# Patient Record
Sex: Male | Born: 1985 | Race: Black or African American | Hispanic: No | Marital: Single | Smoking: Current every day smoker
Health system: Southern US, Community
[De-identification: ages and names within clinical notes are randomized; demographics above are authoritative.]

## PROBLEM LIST (undated history)

## (undated) DIAGNOSIS — Z789 Other specified health status: Secondary | ICD-10-CM

## (undated) HISTORY — PX: FRACTURE SURGERY: SHX138

## (undated) HISTORY — PX: TONSILLECTOMY: SUR1361

---

## 2004-04-02 ENCOUNTER — Emergency Department: Payer: Self-pay | Admitting: Unknown Physician Specialty

## 2005-03-02 ENCOUNTER — Emergency Department: Payer: Self-pay | Admitting: Emergency Medicine

## 2005-12-07 ENCOUNTER — Emergency Department: Payer: Self-pay | Admitting: Emergency Medicine

## 2007-08-27 ENCOUNTER — Emergency Department: Payer: Self-pay | Admitting: Emergency Medicine

## 2008-05-21 ENCOUNTER — Emergency Department: Payer: Self-pay | Admitting: Emergency Medicine

## 2008-10-18 ENCOUNTER — Emergency Department: Payer: Self-pay | Admitting: Emergency Medicine

## 2009-03-27 ENCOUNTER — Emergency Department: Payer: Self-pay | Admitting: Unknown Physician Specialty

## 2009-03-29 ENCOUNTER — Encounter: Admission: RE | Admit: 2009-03-29 | Discharge: 2009-03-29 | Payer: Self-pay | Admitting: Occupational Medicine

## 2012-11-23 ENCOUNTER — Emergency Department: Payer: Self-pay | Admitting: Emergency Medicine

## 2013-08-09 ENCOUNTER — Emergency Department: Payer: Self-pay | Admitting: Emergency Medicine

## 2013-11-07 IMAGING — CR DG CHEST 2V
1 series · 2 of 2 positions shown · non-contrast
Comparison: none

REASON FOR EXAM: chest pain     flex 41
COMMENTS:   LMP: (Male)

PROCEDURE:     DXR - DXR CHEST PA (OR AP) AND LATERAL  - November 23, 2012 [DATE]
RESULT:     The lungs are clear. The heart and pulmonary vessels are normal.
The bony and mediastinal structures are unremarkable. There is no effusion.
There is no pneumothorax or evidence of congestive failure.

[Series 1: pa · 0.17mm/px · 2 of 2 slices shown]
[im 1/2]
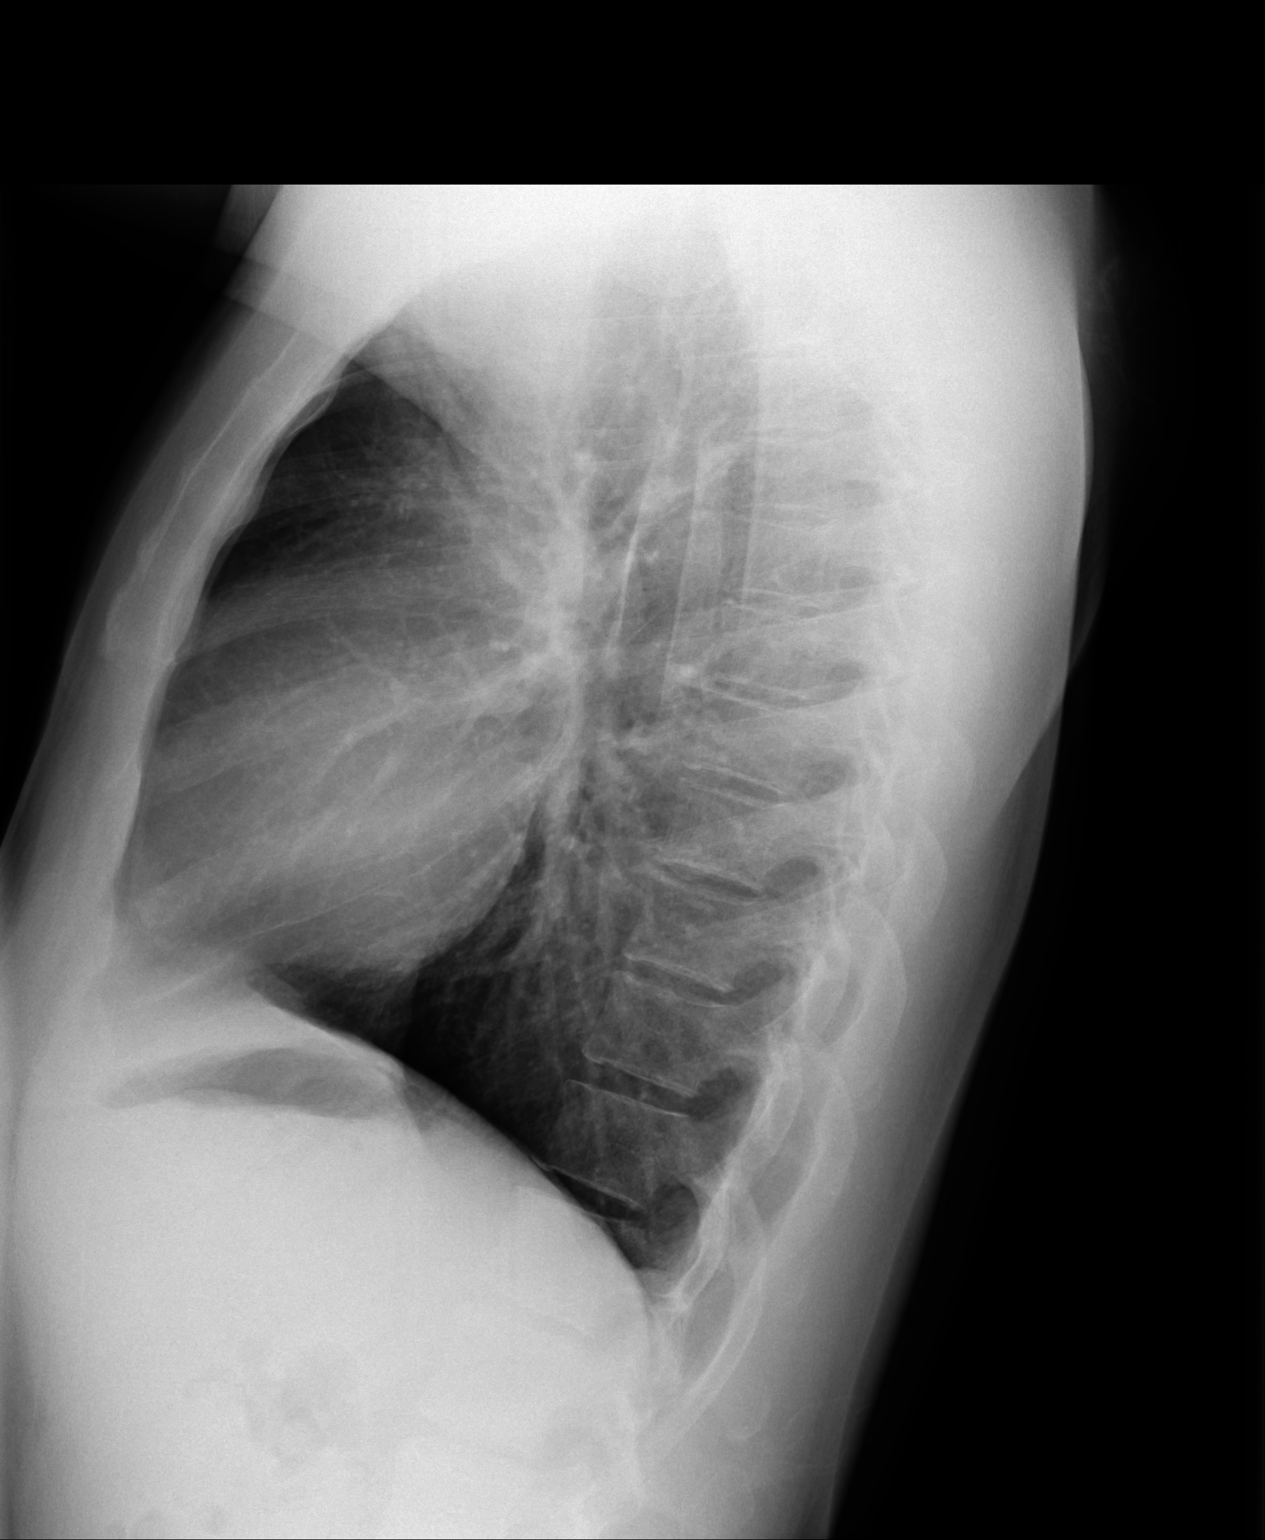
[im 2/2]
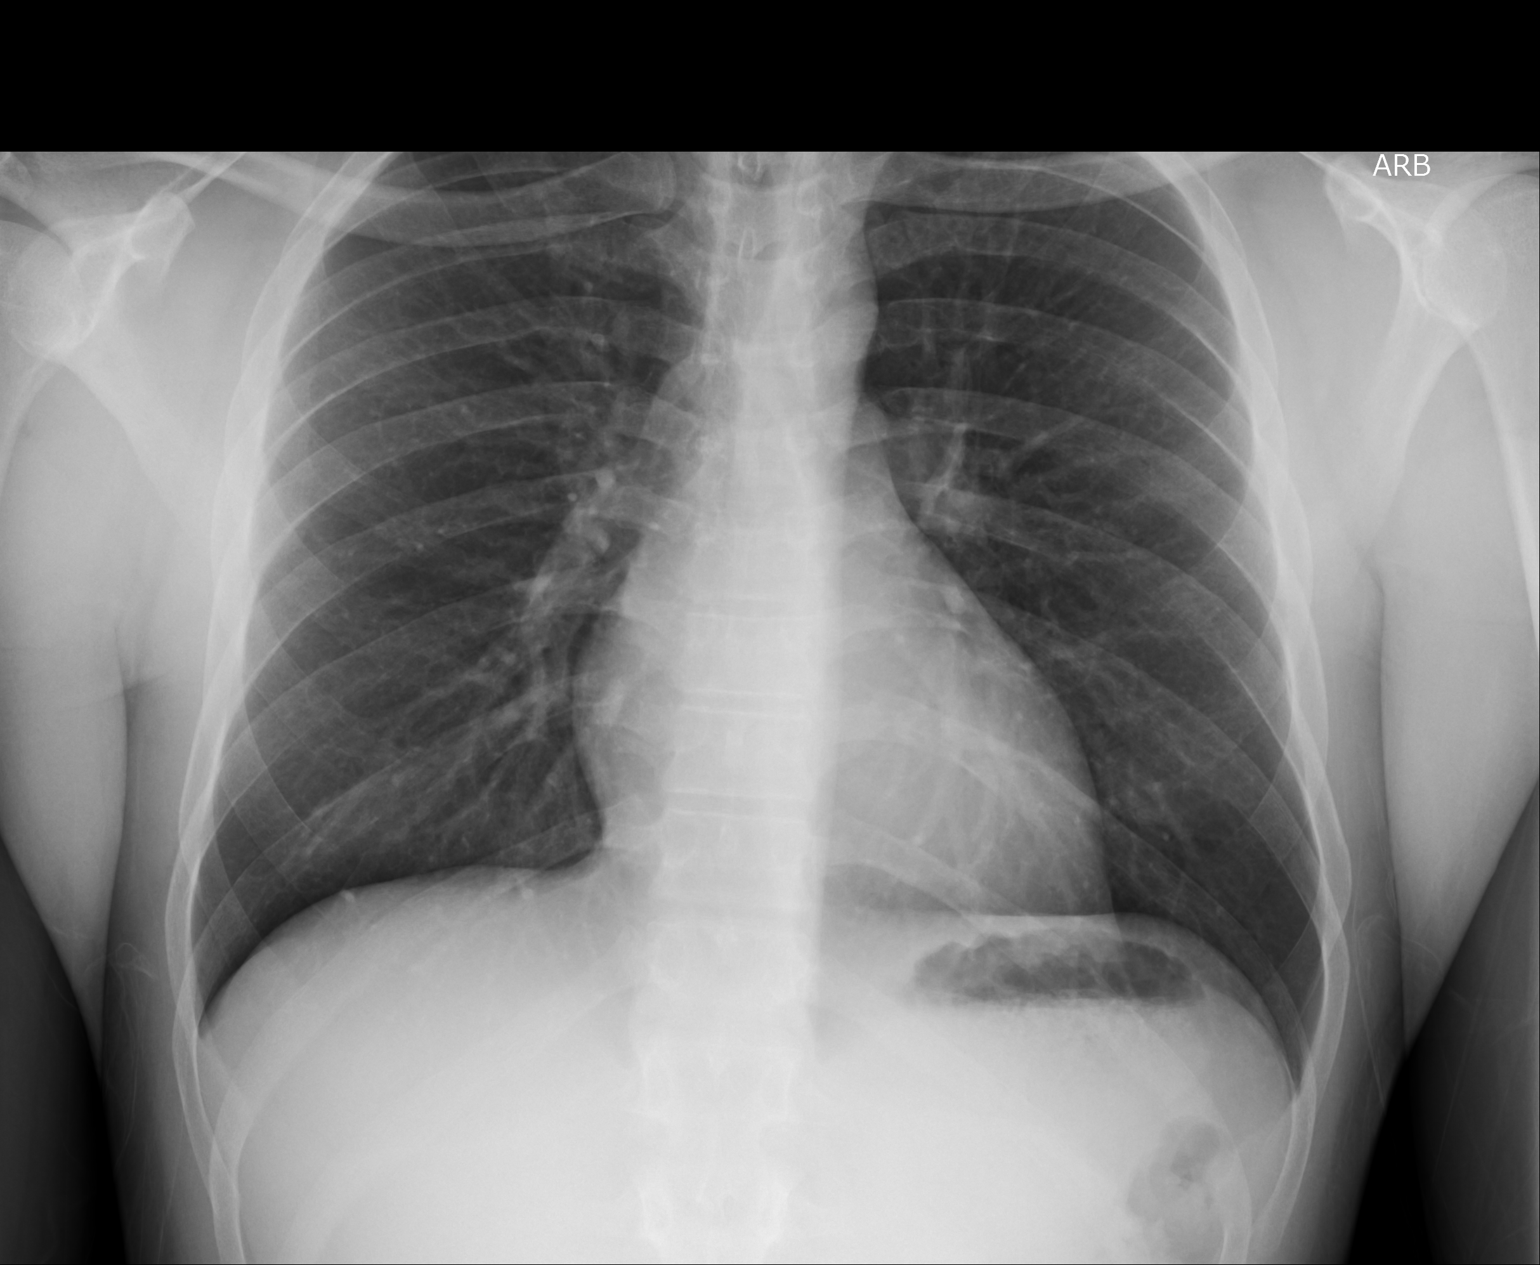

[2 of 2 positions shown; findings below may reference images not displayed]

IMPRESSION: No acute cardiopulmonary disease.

[REDACTED]

## 2022-06-17 ENCOUNTER — Other Ambulatory Visit: Payer: Self-pay

## 2022-06-17 ENCOUNTER — Emergency Department
Admission: EM | Admit: 2022-06-17 | Discharge: 2022-06-17 | Disposition: A | Discharge status: Home / Self Care | Payer: 59 | Attending: Emergency Medicine | Admitting: Emergency Medicine

## 2022-06-17 ENCOUNTER — Emergency Department: Payer: 59

## 2022-06-17 DIAGNOSIS — S52591A Other fractures of lower end of right radius, initial encounter for closed fracture: Secondary | ICD-10-CM | POA: Insufficient documentation

## 2022-06-17 DIAGNOSIS — S52501A Unspecified fracture of the lower end of right radius, initial encounter for closed fracture: Secondary | ICD-10-CM

## 2022-06-17 DIAGNOSIS — S6991XA Unspecified injury of right wrist, hand and finger(s), initial encounter: Secondary | ICD-10-CM | POA: Diagnosis present

## 2022-06-17 MED ORDER — HYDROMORPHONE HCL 1 MG/ML IJ SOLN
1.0000 mg | Freq: Once | INTRAMUSCULAR | Status: AC
  Administered 2022-06-17: 1 mg via INTRAMUSCULAR
  Filled 2022-06-17: qty 1

## 2022-06-17 MED ORDER — OXYCODONE-ACETAMINOPHEN 5-325 MG PO TABS: 1.0000 | ORAL_TABLET | ORAL | 0 refills | Status: DC | PRN

## 2022-06-17 MED ORDER — ONDANSETRON 4 MG PO TBDP
4.0000 mg | ORAL_TABLET | Freq: Once | ORAL | Status: AC
  Administered 2022-06-17: 4 mg via ORAL
  Filled 2022-06-17: qty 1
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
## 2022-06-17 NOTE — ED Notes (Signed)
Pt A&Ox4; denies LOC; denies HA currently; R wrist obviously swollen; pt able to wiggle all R fingers; cap refill less than 3 seconds.

## 2022-06-17 NOTE — ED Notes (Signed)
EDP Paduchowski at bedside.  
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
## 2022-06-17 NOTE — Discharge Instructions (Addendum)
Please call the number provided for orthopedics to arrange a follow-up appointment in approximately 1 week for recheck/reevaluation.  Please wear your sling until seen by orthopedics.  He may take your pain medication as needed but only as prescribed.  Do not drink alcohol or drive while taking this medicine.

## 2022-06-21 ENCOUNTER — Ambulatory Visit
Admission: RE | Admit: 2022-06-21 | Discharge: 2022-06-21 | Disposition: A | Discharge status: Home / Self Care | Payer: 59 | Source: Ambulatory Visit | Attending: Orthopedic Surgery | Admitting: Orthopedic Surgery

## 2022-06-21 ENCOUNTER — Other Ambulatory Visit: Payer: Self-pay | Admitting: Orthopedic Surgery

## 2022-06-21 DIAGNOSIS — S52571A Other intraarticular fracture of lower end of right radius, initial encounter for closed fracture: Secondary | ICD-10-CM

## 2022-06-27 ENCOUNTER — Other Ambulatory Visit: Payer: Self-pay | Admitting: Orthopedic Surgery

## 2022-06-27 ENCOUNTER — Encounter: Payer: Self-pay | Admitting: Orthopedic Surgery

## 2022-06-28 ENCOUNTER — Ambulatory Visit: Payer: 59 | Admitting: Anesthesiology

## 2022-06-28 ENCOUNTER — Other Ambulatory Visit: Payer: Self-pay

## 2022-06-28 ENCOUNTER — Encounter
Admission: RE | Disposition: A | Discharge status: Home / Self Care | Payer: Self-pay | Source: Ambulatory Visit | Attending: Orthopedic Surgery

## 2022-06-28 ENCOUNTER — Encounter: Payer: Self-pay | Admitting: Orthopedic Surgery

## 2022-06-28 ENCOUNTER — Ambulatory Visit
Admission: RE | Admit: 2022-06-28 | Discharge: 2022-06-28 | Disposition: A | Discharge status: Home / Self Care | Payer: 59 | Source: Ambulatory Visit | Attending: Orthopedic Surgery | Admitting: Orthopedic Surgery

## 2022-06-28 DIAGNOSIS — G5601 Carpal tunnel syndrome, right upper limb: Secondary | ICD-10-CM | POA: Diagnosis not present

## 2022-06-28 DIAGNOSIS — F17200 Nicotine dependence, unspecified, uncomplicated: Secondary | ICD-10-CM | POA: Diagnosis not present

## 2022-06-28 DIAGNOSIS — W19XXXA Unspecified fall, initial encounter: Secondary | ICD-10-CM | POA: Diagnosis not present

## 2022-06-28 DIAGNOSIS — S52571A Other intraarticular fracture of lower end of right radius, initial encounter for closed fracture: Secondary | ICD-10-CM | POA: Insufficient documentation

## 2022-06-28 DIAGNOSIS — Y9389 Activity, other specified: Secondary | ICD-10-CM | POA: Insufficient documentation

## 2022-06-28 HISTORY — DX: Other specified health status: Z78.9

## 2022-06-28 HISTORY — PX: OPEN REDUCTION INTERNAL FIXATION (ORIF) DISTAL RADIAL FRACTURE: SHX5989

## 2022-06-28 HISTORY — PX: CARPAL TUNNEL RELEASE: SHX101

## 2022-06-28 SURGERY — OPEN REDUCTION INTERNAL FIXATION (ORIF) DISTAL RADIUS FRACTURE
Anesthesia: General | Site: Hand | Laterality: Right

## 2022-06-28 MED ORDER — FENTANYL CITRATE (PF) 100 MCG/2ML IJ SOLN
100.0000 ug | Freq: Once | INTRAMUSCULAR | Status: AC
  Administered 2022-06-28 (×2): 25 ug via INTRAVENOUS
  Administered 2022-06-28: 50 ug via INTRAVENOUS

## 2022-06-28 MED ORDER — 0.9 % SODIUM CHLORIDE (POUR BTL) OPTIME
TOPICAL | Status: DC | PRN
  Administered 2022-06-28: 300 mL

## 2022-06-28 MED ORDER — BUPIVACAINE HCL (PF) 0.5 % IJ SOLN
INTRAMUSCULAR | Status: DC | PRN
  Administered 2022-06-28: 25 mL

## 2022-06-28 MED ORDER — CEFAZOLIN SODIUM-DEXTROSE 2-4 GM/100ML-% IV SOLN
2.0000 g | INTRAVENOUS | Status: AC
  Administered 2022-06-28: 2 g via INTRAVENOUS

## 2022-06-28 MED ORDER — LIDOCAINE HCL (CARDIAC) PF 100 MG/5ML IV SOSY
PREFILLED_SYRINGE | INTRAVENOUS | Status: DC | PRN
  Administered 2022-06-28: 80 mg via INTRAVENOUS

## 2022-06-28 MED ORDER — LACTATED RINGERS IV SOLN: INTRAVENOUS | Status: DC

## 2022-06-28 MED ORDER — MIDAZOLAM HCL 2 MG/2ML IJ SOLN
2.0000 mg | Freq: Once | INTRAMUSCULAR | Status: AC
  Administered 2022-06-28: 2 mg via INTRAVENOUS

## 2022-06-28 MED ORDER — ONDANSETRON HCL 4 MG/2ML IJ SOLN
INTRAMUSCULAR | Status: DC | PRN
  Administered 2022-06-28: 4 mg via INTRAVENOUS

## 2022-06-28 MED ORDER — DEXAMETHASONE SODIUM PHOSPHATE 10 MG/ML IJ SOLN
INTRAMUSCULAR | Status: DC | PRN
  Administered 2022-06-28: 4 mg via INTRAVENOUS

## 2022-06-28 MED ORDER — MIDAZOLAM HCL 5 MG/5ML IJ SOLN
INTRAMUSCULAR | Status: DC | PRN
  Administered 2022-06-28: 2 mg via INTRAVENOUS

## 2022-06-28 MED ORDER — HYDROCODONE-ACETAMINOPHEN 5-325 MG PO TABS: 1.0000 | ORAL_TABLET | Freq: Four times a day (QID) | ORAL | 0 refills | Status: DC | PRN

## 2022-06-28 MED ORDER — PROPOFOL 500 MG/50ML IV EMUL
INTRAVENOUS | Status: DC | PRN
  Administered 2022-06-28 (×2): 100 mg via INTRAVENOUS
  Administered 2022-06-28: 200 mg via INTRAVENOUS

## 2022-06-28 MED ORDER — BUPIVACAINE LIPOSOME 1.3 % IJ SUSP
INTRAMUSCULAR | Status: DC | PRN
  Administered 2022-06-28: 10 mL

## 2022-06-28 MED ORDER — ACETAMINOPHEN 10 MG/ML IV SOLN: 1000.0000 mg | Freq: Once | INTRAVENOUS | Status: DC

## 2022-06-28 MED ORDER — CEFAZOLIN SODIUM-DEXTROSE 2-4 GM/100ML-% IV SOLN: 2.0000 g | INTRAVENOUS | Status: DC

## 2022-06-28 SURGICAL SUPPLY — 45 items
APL PRP STRL LF DISP 70% ISPRP (MISCELLANEOUS) ×1
BIOMET F 3 PLATING SYSTEM HIGH FLEX LEFT PLATE (Plate) IMPLANT
BIT DRILL 2 FAST STEP (BIT) IMPLANT
BIT DRILL 2.5X4 QC (BIT) IMPLANT
BNDG CMPR STD VLCR NS LF 5.8X4 (GAUZE/BANDAGES/DRESSINGS) ×1
BNDG ELASTIC 4X5.8 VLCR NS LF (GAUZE/BANDAGES/DRESSINGS) ×1 IMPLANT
CHLORAPREP W/TINT 26 (MISCELLANEOUS) ×1 IMPLANT
COVER LIGHT HANDLE UNIVERSAL (MISCELLANEOUS) ×2 IMPLANT
DRAPE FLUOR MINI C-ARM 54X84 (DRAPES) ×1 IMPLANT
ELECT REM PT RETURN 9FT ADLT (ELECTROSURGICAL) ×1
ELECTRODE REM PT RTRN 9FT ADLT (ELECTROSURGICAL) ×1 IMPLANT
GAUZE 4X4 16PLY ~~LOC~~+RFID DBL (SPONGE) IMPLANT
GAUZE SPONGE 4X4 12PLY STRL (GAUZE/BANDAGES/DRESSINGS) ×1 IMPLANT
GAUZE XEROFORM 1X8 LF (GAUZE/BANDAGES/DRESSINGS) ×2 IMPLANT
GLOVE SURG SYN 9.0  PF PI (GLOVE) ×4
GLOVE SURG SYN 9.0 PF PI (GLOVE) ×1 IMPLANT
GOWN STRL REIN 2XL XLG LVL4 (GOWN DISPOSABLE) ×2 IMPLANT
GOWN STRL REUS W/ TWL LRG LVL3 (GOWN DISPOSABLE) ×1 IMPLANT
GOWN STRL REUS W/TWL LRG LVL3 (GOWN DISPOSABLE) ×1
K-WIRE 1.6 (WIRE) ×2
K-WIRE FX5X1.6XNS BN SS (WIRE) ×2
KIT TURNOVER KIT A (KITS) ×1 IMPLANT
KWIRE FX5X1.6XNS BN SS (WIRE) IMPLANT
NS IRRIG 500ML POUR BTL (IV SOLUTION) ×1 IMPLANT
PACK EXTREMITY ARMC (MISCELLANEOUS) ×1 IMPLANT
PADDING CAST BLEND 4X4 STRL (MISCELLANEOUS) ×2 IMPLANT
PEG SUBCHONDRAL SMOOTH 2.0X18 (Peg) IMPLANT
PEG SUBCHONDRAL SMOOTH 2.0X22 (Peg) IMPLANT
PEG SUBCHONDRAL SMOOTH 2.0X24 (Peg) IMPLANT
PEG THREADED 2.5MMX18MM LONG (Peg) IMPLANT
PEG THREADED 2.5MMX20MM LONG (Peg) IMPLANT
PLATE F3 FRAG HI STR RT (Plate) IMPLANT
PLATE STAN 24.4X59.5 RT (Plate) IMPLANT
SCREW BN 12X3.5XNS CORT TI (Screw) IMPLANT
SCREW CORT 3.5X10 LNG (Screw) IMPLANT
SCREW CORT 3.5X12 (Screw) ×2 IMPLANT
SCREW MULTI DIRECT 18MM (Screw) IMPLANT
SCREW PEG 2.5X10 NONLOCK (Screw) IMPLANT
SCREW PEG 2.5X16 NONLOCK (Screw) IMPLANT
SPLINT CAST 1 STEP 3X12 (MISCELLANEOUS) ×1 IMPLANT
SPLINT CAST 1 STEP 4X30 (MISCELLANEOUS) ×1 IMPLANT
SUT ETHILON 4-0 (SUTURE) ×3
SUT ETHILON 4-0 FS2 18XMFL BLK (SUTURE) ×3
SUT VICRYL 3-0 27IN (SUTURE) ×1 IMPLANT
SUTURE ETHLN 4-0 FS2 18XMF BLK (SUTURE) ×1 IMPLANT

## 2022-06-28 NOTE — Discharge Instructions (Addendum)
Keep arm elevated is much as possible through the weekend Work on finger motion as soon as your arm allows Pain medicine as previously directed Keep dressing clean and dry Call office if you are having problems [phone]

## 2022-06-28 NOTE — Anesthesia Preprocedure Evaluation (Signed)
Anesthesia Evaluation  Patient identified by MRN, date of birth, ID band Patient awake    Reviewed: Allergy & Precautions, H&P , NPO status , Patient's Chart, lab work & pertinent test results  Airway Mallampati: I  TM Distance: >3 FB Neck ROM: Full    Dental no notable dental hx.    Pulmonary neg pulmonary ROS, Current Smoker   Pulmonary exam normal breath sounds clear to auscultation       Cardiovascular negative cardio ROS Normal cardiovascular exam Rhythm:Regular Rate:Normal     Neuro/Psych negative neurological ROS  negative psych ROS   GI/Hepatic negative GI ROS, Neg liver ROS,,,  Endo/Other  negative endocrine ROS    Renal/GU negative Renal ROS  negative genitourinary   Musculoskeletal negative musculoskeletal ROS (+)    Abdominal   Peds negative pediatric ROS (+)  Hematology negative hematology ROS (+)   Anesthesia Other Findings   Reproductive/Obstetrics negative OB ROS                             Anesthesia Physical Anesthesia Plan  ASA: 2  Anesthesia Plan: General   Post-op Pain Management:    Induction: Intravenous  PONV Risk Score and Plan:   Airway Management Planned: Natural Airway and Nasal Cannula  Additional Equipment:   Intra-op Plan:   Post-operative Plan:   Informed Consent: I have reviewed the patients History and Physical, chart, labs and discussed the procedure including the risks, benefits and alternatives for the proposed anesthesia with the patient or authorized representative who has indicated his/her understanding and acceptance.     Dental Advisory Given  Plan Discussed with: Anesthesiologist, CRNA and Surgeon  Anesthesia Plan Comments: (Patient consented for risks of anesthesia including but not limited to:  - adverse reactions to medications - risk of airway placement if required - damage to eyes, teeth, lips or other oral mucosa -  nerve damage due to positioning  - sore throat or hoarseness - Damage to heart, brain, nerves, lungs, other parts of body or loss of life  Patient voiced understanding.)       Anesthesia Quick Evaluation

## 2022-06-28 NOTE — Addendum Note (Signed)
Addendum  created 06/28/22 1618 by Genia Del, CRNA   Intraprocedure Meds edited

## 2022-06-28 NOTE — Transfer of Care (Signed)
Immediate Anesthesia Transfer of Care Note  Patient: Austin Blankenship  Procedure(s) Performed: OPEN REDUCTION INTERNAL FIXATION (ORIF) DISTAL RADIUS FRACTURE (Right: Hand) CARPAL TUNNEL RELEASE (Right: Hand)  Patient Location: PACU  Anesthesia Type:General  Level of Consciousness: awake and sedated  Airway & Oxygen Therapy: Patient Spontanous Breathing and Patient connected to nasal cannula oxygen  Post-op Assessment: Report given to RN and Post -op Vital signs reviewed and stable  Post vital signs: Reviewed and stable  Last Vitals:  Vitals Value Taken Time  BP 138/83 06/28/22 1456  Temp 36.3 C 06/28/22 1456  Pulse 57 06/28/22 1458  Resp 17 06/28/22 1458  SpO2 97 % 06/28/22 1458  Vitals shown include unvalidated device data.  Last Pain:  Vitals:   06/28/22 1456  TempSrc:   PainSc: 0-No pain         Complications: No notable events documented.

## 2022-06-28 NOTE — H&P (Signed)
Chief Complaint Patient presents with Right Wrist - Numbness, Pain, Edema   History of the Present Illness: Austin Blankenship is a 37 y.o. male here today for an initial evaluation of his right wrist. He had a comminuted distal radius fracture of the right wrist last week. There had been a call from the ER and [employer], PA evaluated him on 06/21/2022. I would like a CT to see if this was amenable for fixation. He comes in for discussion of history and physical for surgical intervention. He is accompanied by an adult male.  The patient sustained an injury to his right wrist last Monday, 06/17/2022 while attempting to hang crate, resulting in a fall. He reports experiencing numbness and tingling sensations in his fingers, which most likely related to carpal tunnel syndrome. Prior to the injury, he did not experience these symptoms, but rather a sensation of pressure. 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  I have reviewed past medical, surgical, social and family history, and allergies as documented in the EMR.  Past Medical History: No past medical history on file.  Past Surgical History: Past Surgical History: Procedure Laterality Date FRACTURE SURGERY TONSILLECTOMY  Past Family History: Family History Problem Relation Age of Onset No Known Problems Mother No Known Problems Father  Medications: Current Outpatient Medications Medication Sig Dispense Refill HYDROcodone-acetaminophen (NORCO) 5-325 mg tablet Take 1 tablet by mouth every 8 (eight) hours as needed 15 tablet 0 oxyCODONE-acetaminophen (PERCOCET) 5-325 mg tablet Take 1 tablet by mouth every 4 (four) hours as needed (Patient not taking: Reported on 06/27/2022)  No current facility-administered medications for this visit.  Allergies: No Known Allergies  Body mass index is 27.22 kg/m.  Review of Systems: A comprehensive 14 point ROS was performed, reviewed, and the pertinent orthopaedic  findings are documented in the HPI.  Vitals: 06/27/22 1052 BP: 126/74   General Physical Examination:  Musculoskeletal Examination: General/Constitutional: No apparent distress: well-nourished and well developed. Eyes: Pupils equal, round with synchronous movement. Lungs: Clear to auscultation HEENT: Normal Vascular: No edema, swelling or tenderness, except as noted in detailed exam. Cardiac: Heart rate and rhythm is regular. Integumentary: No impressive skin lesions present, except as noted in detailed exam. Neuro/Psych: Normal mood and affect, oriented to person, place, and time.  Arm in splint, tingling and decreased sensation to thumb index and middle fingers  Radiographs:  CT scan of the right wrist were ordered and personally reviewed today. These show distal extra-articular fracture in the right wrist.  Assessment: ICD-10-CM 1. Other closed intra-articular fracture of distal end of right radius, initial encounter S52.571A 2. Acute carpal tunnel syndrome of right wrist G56.01  Plan:  The patient has clinic findings of a right distal extra-articular fracture.Necessitates ORIF.  We discussed regarding the potential benefits of a carpal tunnel release for the treatment of carpal tunnel syndrome. A work note will be provided to the patient to stay out of work for 8 weeks.  Document Attestation: Alessandra Grout, have reviewed and updated documentation for [facility], MD, utilizing [employer] DAX.  Electronically signed by Marlena Clipper, MD at 06/27/2022 8:34 PM     Reviewed  H+P. No changes noted.

## 2022-06-28 NOTE — Anesthesia Postprocedure Evaluation (Signed)
Anesthesia Post Note  Patient: Austin Blankenship  Procedure(s) Performed: OPEN REDUCTION INTERNAL FIXATION (ORIF) DISTAL RADIUS FRACTURE (Right: Hand) CARPAL TUNNEL RELEASE (Right: Hand)  Patient location during evaluation: PACU Anesthesia Type: General Level of consciousness: awake and alert Pain management: pain level controlled Vital Signs Assessment: post-procedure vital signs reviewed and stable Respiratory status: spontaneous breathing, nonlabored ventilation, respiratory function stable and patient connected to nasal cannula oxygen Cardiovascular status: blood pressure returned to baseline and stable Postop Assessment: no apparent nausea or vomiting Anesthetic complications: no   No notable events documented.   Last Vitals:  Vitals:   06/28/22 1456 06/28/22 1500  BP: 138/83 129/85  Pulse: 61 (!) 49  Resp: 14 16  Temp: (!) 36.3 C (!) 36.3 C  SpO2: 95% 97%    Last Pain:  Vitals:   06/28/22 1456  TempSrc:   PainSc: 0-No pain                 Koury Roddy C Mialani Reicks

## 2022-06-28 NOTE — Op Note (Signed)
06/28/2022  2:55 PM  PATIENT:  Austin Blankenship  37 y.o. male  PRE-OPERATIVE DIAGNOSIS:  Other closed intra-articular fracture of distal end of right radius, initial encounter S52.571A acute carpal tunneel syndrome of wrist right  POST-OPERATIVE DIAGNOSIS:  Other closed intra-articular fracture of distal end of right radius, initial encounter  Acute carpal tunneel syndrome of wrist right  PROCEDURE:  Procedure(s): OPEN REDUCTION INTERNAL FIXATION (ORIF) DISTAL RADIUS FRACTURE (Right) CARPAL TUNNEL RELEASE (Right)  SURGEON: Leitha Schuller, MD  ASSISTANTS: None  ANESTHESIA:   general  EBL:  Total I/O In: 500 [I.V.:400; IV Piggyback:100] Out: 200 [Blood:200]  BLOOD ADMINISTERED:none  DRAINS: none   LOCAL MEDICATIONS USED:  OTHER Exparel block given by anesthesia  SPECIMEN:  No Specimen  DISPOSITION OF SPECIMEN:  N/A  COUNTS:  YES  TOURNIQUET:   Total Tourniquet Time Documented: Forearm (Right) - 72 minutes Forearm (Right) - 22 minutes Total: Forearm (Right) - 94 minutes   IMPLANTS: [employer] DVR standard with plate short with 3 cortical screws and 2 multidirectional screws with a dorsal plate with multiple locking and nonlocking screws  DICTATION: .Dragon Dictation patient was brought to the operating room and after adequate anesthesia was obtained the right arm was prepped and draped in the usual sterile fashion.  After patient identification and timeout procedure tourniquet was raised and a volar approach was made to the distal radius with incision carried out over the FCR tendon.  Tendon sheath was incised and the tendon retracted radially deep fascia incised and the tendon retracted along with the artery and veins to protect them the pronator was elevated off the proximal fragment distal fragment already been stripped.  With traction applied off the end of the table it was difficult even then to get restoration of length there were multiple bone fragments volarly  which were loose and impinging volarly.  Some of the smaller fragments were removed.  The radial styloid fragment was also difficult to reduce attempt was made with dorsal pinning along the traction to try to maintain alignment and a volar plate was applied but the screws did not hold into the distal fragment.  Because of this treatment failure of the volar plate fixation is felt a dorsal plate was required to get the radial styloid in better position dorsal incision was made the EPL was identified released and had some fraying.  On the radial side dorsally a plate was contoured and locking screws placed distally and the within the distal fragment then nonlocking screws with a precontoured plate were brought down and this helped reduce the fragments near anatomically.  After this plate had been completed with tourniquet down because of excessive time because of the extent difficult exposure and attempt reduction volarly a volar the volar plate was reapplied and 3 cortical screws placed this act as a buttress with 2 multidirectional screws placed to help maintain alignment there was not complete restoration of volar tilt it was near neutral but length and radial inclination were near normal.  At this point the tourniquet was raised again and the carpal tunnel release was carried out as the patient had an acute carpal tunnel skin was in line with the ring metacarpal approximately 2 cm incision subcutaneous tissue spread transverse carpal ligament identified.  Small incision made and a vascular hemostat placed deep to protect the nerve with release proximal and distal.  The wounds were thoroughly irrigated and the wounds closed with 3-0 Vicryl subcutaneously 4-0 nylon for the skin followed by Xeroform  4 x 4 web roll volar splint and Ace wrap  PLAN OF CARE: Discharge to home after PACU  PATIENT DISPOSITION:  PACU - hemodynamically stable.

## 2022-06-28 NOTE — Anesthesia Procedure Notes (Signed)
Anesthesia Regional Block: Supraclavicular block   Pre-Anesthetic Checklist: , timeout performed,  Correct Patient, Correct Site, Correct Laterality,  Correct Procedure, Correct Position, site marked,  Risks and benefits discussed,  Surgical consent,  Pre-op evaluation,  At surgeon's request and post-op pain management  Laterality: Right  Prep: chloraprep       Needles:  Injection technique: Single-shot  Needle Type: Echogenic Stimulator Needle      Needle Gauge: 21     Additional Needles:   Procedures:, nerve stimulator,,, ultrasound used (permanent image in chart),,     Nerve Stimulator or Paresthesia:  Response: bicep contraction, 0.45 mA  Additional Responses:   Narrative:  Start time: 06/28/2022 11:25 AM End time: 06/28/2022 11:35 AM Injection made incrementally with aspirations every 5 mL.  Performed by: Personally   Additional Notes: Functioning IV was confirmed and monitors applied.  Sterile prep and drape,hand hygiene and sterile gloves were used.Ultrasound guidance: relevant anatomy identified, needle position confirmed, local anesthetic spread visualized around nerve(s)., vascular puncture avoided.  Image printed for medical record.  Negative aspiration and negative test dose prior to incremental administration of local anesthetic. The patient tolerated the procedure well. Vitals signes recorded in RN notes.intercostobrachial block also, musculocutaneous block

## 2022-07-01 ENCOUNTER — Encounter: Payer: Self-pay | Admitting: Orthopedic Surgery

## 2022-07-22 ENCOUNTER — Ambulatory Visit: Payer: 59 | Attending: Student | Admitting: Occupational Therapy

## 2022-07-22 DIAGNOSIS — M25631 Stiffness of right wrist, not elsewhere classified: Secondary | ICD-10-CM | POA: Insufficient documentation

## 2022-07-22 DIAGNOSIS — M25641 Stiffness of right hand, not elsewhere classified: Secondary | ICD-10-CM | POA: Diagnosis present

## 2022-07-22 DIAGNOSIS — M6281 Muscle weakness (generalized): Secondary | ICD-10-CM | POA: Diagnosis present

## 2022-07-22 DIAGNOSIS — M79644 Pain in right finger(s): Secondary | ICD-10-CM | POA: Diagnosis present

## 2022-07-22 DIAGNOSIS — M25531 Pain in right wrist: Secondary | ICD-10-CM | POA: Insufficient documentation

## 2022-07-22 DIAGNOSIS — R600 Localized edema: Secondary | ICD-10-CM | POA: Insufficient documentation

## 2022-07-22 DIAGNOSIS — L905 Scar conditions and fibrosis of skin: Secondary | ICD-10-CM | POA: Insufficient documentation

## 2022-07-22 NOTE — Therapy (Signed)
[facility] [address], [place], [address] Phone: [phone]   Fax:  [phone]  Occupational Therapy Evaluation  Patient Details  Name: Austin Blankenship MRN: [id number] Date of Birth: 1986/01/27 Referring Provider (OT): [place] PA   Encounter Date: 07/22/2022   OT End of Session - 07/22/22 1323     Visit Number 1    Number of Visits 24    Date for OT Re-Evaluation 10/14/22    OT Start Time 1200    OT Stop Time 1310    OT Time Calculation (min) 70 min    Activity Tolerance Patient tolerated treatment well;Patient limited by pain    Behavior During Therapy [facility] for tasks assessed/performed             Past Medical History:  Diagnosis Date   Medical history non-contributory     Past Surgical History:  Procedure Laterality Date   CARPAL TUNNEL RELEASE Right 06/28/2022   Procedure: CARPAL TUNNEL RELEASE;  Surgeon: [name], MD;  Location: [facility];  Service: Orthopedics;  Laterality: Right;   FRACTURE SURGERY     left leg.  age 70 or 68   OPEN REDUCTION INTERNAL FIXATION (ORIF) DISTAL RADIAL FRACTURE Right 06/28/2022   Procedure: OPEN REDUCTION INTERNAL FIXATION (ORIF) DISTAL RADIUS FRACTURE;  Surgeon: [name], MD;  Location: [facility];  Service: Orthopedics;  Laterality: Right;   TONSILLECTOMY     and Adenoids as child    There were no vitals filed for this visit.   Subjective Assessment - 07/22/22 1316     Subjective  I tried to work my wrist and fingers - the swelling looks better than it was but still bad and tight my fingers and wrist -    Pertinent History 07/12/22 ORTHo NOTE- Austin Blankenship is a 37 y.o. male who presents today for suture removal status post a open reduction internal fixation of right distal radius fracture in addition to a carpal tunnel release. The patient suffered a right distal radius fracture and underwent surgery with Dr. Rosita Kea on 06/28/2022. The  patient had a complex distal radius fracture which did require use of both the volar and dorsal plate. The patient also suffered acute carpal tunnel at the time the injury and did have to undergo a carpal tunnel release. The patient was evaluated for skin check and was placed in a Velcro wrist splint, he was instructed to begin using his hand and flexing and extending his fingers. The patient presents today with still moderate swelling throughout the right hand, he does still report a burning in the median nerve distribution but does feel that the sensation is improving. He has been working on trying to make a full fist. He has been nonweightbearing to the right upper extremity. The patient is taking hydrocodone as needed for discomfort. Pain score today is a 7 out of 10. 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 Yes    Pain Score 3     Pain Location Wrist   digits   Pain Orientation Right    Pain Descriptors / Indicators Aching;Tightness;Pins and needles;Burning;Sore;Shooting    Pain Type Surgical pain    Pain Onset 1 to 4 weeks ago    Pain Frequency Constant  Austin Blankenship OT Assessment - 07/22/22 0001       Assessment   Medical Diagnosis R distal radius fx with ORF and CTR    Referring Provider (OT) Marney Doctor PA    Onset Date/Surgical Date 06/28/22    Hand Dominance Right      Precautions   Required Braces or Orthoses --   wrist splint on most of time     Restrictions   Weight Bearing Restrictions Yes    RUE Weight Bearing Non weight bearing      Home  Environment   Lives With Alone      Prior Function   [employer]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      AROM   Right Forearm Pronation 70 Degrees    Right Forearm Supination 50 Degrees    Right Wrist Extension 5 Degrees    Right Wrist Flexion 30 Degrees    Right Wrist Radial  Deviation 12 Degrees    Right Wrist Ulnar Deviation 5 Degrees      Right Hand AROM   R Thumb MCP 0-60 35 Degrees    R Thumb IP 0-80 30 Degrees    R Thumb Radial ABduction/ADduction 0-55 40    R Thumb Palmar ABduction/ADduction 0-45 48    R Thumb Opposition to Index --   Opposition to 2nd   R Index  MCP 0-90 60 Degrees   -15 ext   R Index PIP 0-100 45 Degrees   -40 ext   R Long  MCP 0-90 55 Degrees   -10 ext   R Long PIP 0-100 60 Degrees   -50 ext   R Ring  MCP 0-90 60 Degrees   -10 ext   R Ring PIP 0-100 60 Degrees   -40 ext   R Little  MCP 0-90 60 Degrees   -10 ext   R Little PIP 0-100 60 Degrees   -30 ext                     OT Treatments/Exercises (OP) - 07/22/22 0001       RUE Contrast Bath   Time 8 minutes    Comments prior to PROM and AROM revie of HEP - decrease edema and stiffness             Reviewed with patient and friend home exercises. Patient to do 3 times a day contrast using heating pads and cold pack Soft tissue massage for swelling as well as scar massage Cica -Care scar pad to all scars at nighttime Tubigrip D for under wrist splint most of the time to decrease edema Small to blue foam block at volar part of hand and palm to and support wrist in neutral and splint Passive range of motion to digits as well as thumb at all joints 10 reps with active assisted range of motion to extension 10 reps Tendon glides with facilitation active assisted range of motion for extension 10 reps Active assisted to very gentle range of motion to ulnar radial deviation as well as active range of motion for supination pronation 10 reps each Focus summary rolling right roller to facilitate digit extension as well as wrist in neutral on table 20 reps Opposition picking up 2 cm foam block alternating digits.  Patient can do second and third at the end of session fourth. Active assisted range of motion for thumb palmar radial abduction       OT Education -  07/22/22 1323  Education Details Findings of eval and HEP    Person(s) Educated Patient;Other (comment)    Methods Explanation;Demonstration;Tactile cues;Verbal cues;Handout    Comprehension Verbal cues required;Returned demonstration;Verbalized understanding              OT Short Term Goals - 07/22/22 1330       OT SHORT TERM GOAL #1   Title Patient to be independent in home program to decrease edema and increase motion for pain and to be able to lay fingers and wrist straight on table.    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  Flexor contracture shoulder with pain with attempts of passive range of motion at PIP [name]  Unable to do palmar ABD- thumb resting more in palmar-coming in patient keep wrist in flexion even in brace.    Time 2    Period Weeks    Status New    Target Date 08/05/22      OT SHORT TERM GOAL #2   Title Patient demo understanding to wear wrist correctly and position right arm during the day correct to decrease flexion contractures in digits and  wrist    Baseline Patient wrist hand and wrist splint in flexion; fingers in flexion contractures at the MCP and PIPs-unable to straighten PIPs with passive and active range of motion    Time 3    Period Weeks    Status New    Target Date 08/12/22               OT Long Term Goals - 07/22/22 1333       OT LONG TERM GOAL #1   Title Digit flexion increased for patient to touch palm without increased symptoms to initiate using it in ADLs    Baseline MC flexion 55 to 60 degrees, PIP's 45 to 60 degrees -unable to use hand in any bathing or dressing or eating.    Time 4    Period Weeks    Status New    Target Date 08/19/22      OT LONG TERM GOAL #2   Title Right wrist active range of motion increased to within functional limits for patient to use right hand in 50% of ADLs    Baseline Right wrist extension 5 degrees and flexion 30 degrees, ulnar deviation 5 degrees and radial deviation  12-not using the right hand or wrist activities    Time 6    Period Weeks    Status New    Target Date 09/02/22      OT LONG TERM GOAL #3   Title Right forearm supination pronation increased to within functional limits for patient to turn doorknob with minimal symptoms    Baseline Patient decreased greatly in supination 50 and pronation 70    Time 6    Period Weeks    Status New    Target Date 09/02/22      OT LONG TERM GOAL #4   Title Right digit extension as well as thumb palmar radial abduction within functional limits to put hand in pocket and don gloves with no issues    Baseline Palmar abduction 48 and radial abduction 40 degrees extension limited -10 to -15 at [facility], PIPs -30 to -50    Time 5    Period Weeks    Status New    Target Date 08/26/22      OT LONG TERM GOAL #5   Title Strength in right hand and wrist for patient to be able  to carry 5 pounds with no increase symptoms, push and pull heavy door and initiate using tools    Baseline Patient 3-1/2 weeks postop.  Steri-Strips removed today-severe edema and stiffness in right hand and wrist.    Time 12    Period Weeks    Status New    Target Date 10/14/22                   Plan - 07/22/22 1325     Clinical Impression Statement Patient presented OT evaluation 3-1/2 weeks postop right distal radius fracture with volar and dorsal plate, and carpal tunnel release by Dr. Rosita Kea 06/28/2022.  Patient arrived with severe stiffness and edema in the right  dominant hand and wrist.  Patient I am unable to straighten digits at PIP worse than MC's.  Flexion of digits limited at MC's 255-60 degrees and PIP's 45 to 60 degrees.  Steri-Strips removed on volar and dorsal scar send wrist.  Patient did not initiate any massage or scar massage.  Edema at the wrist increased by 2.5 cm compared to the left.  Patient with pins-and-needles in thumb through fourth digits.  Patient limited severely in functional use of right hand and wrist in  ADLs and IADLs.  Patient can benefit from skilled OT services to decrease edema and pain and increase motion and strength to return to prior level of function.    OT Occupational Profile and History Problem Focused Assessment - Including review of records relating to presenting problem    Occupational performance deficits (Please refer to evaluation for details): ADL's;IADL's;Work;Play;Social Participation;Leisure    Body Structure / Function / Physical Skills ADL;Decreased knowledge of precautions;Flexibility;ROM;UE functional use;Scar mobility;FMC;Dexterity;Sensation;Strength;Pain;IADL;Edema;Coordination    Rehab Potential Fair    Clinical Decision Making Limited treatment options, no task modification necessary    Comorbidities Affecting Occupational Performance: None    Modification or Assistance to Complete Evaluation  No modification of tasks or assist necessary to complete eval    OT Frequency 2x / week    OT Duration 12 weeks    OT Treatment/Interventions Self-care/ADL training;Therapeutic exercise;Patient/family education;Splinting;Paraffin;Fluidtherapy;Contrast Bath;DME and/or AE instruction;Manual Therapy;Passive range of motion;Scar mobilization;Therapeutic activities    Consulted and Agree with Plan of Care Patient             Patient will benefit from skilled therapeutic intervention in order to improve the following deficits and impairments:   Body Structure / Function / Physical Skills: ADL, Decreased knowledge of precautions, Flexibility, ROM, UE functional use, Scar mobility, FMC, Dexterity, Sensation, Strength, Pain, IADL, Edema, Coordination       Visit Diagnosis: Localized edema  Stiffness of right hand, not elsewhere classified  Stiffness of right wrist, not elsewhere classified  Scar condition and fibrosis of skin  Pain in right finger(s)  Pain in right wrist  Muscle weakness (generalized)    Problem List There are no problems to display for this  patient.   Oletta Cohn, OTR/L,CLT 07/22/2022, 1:39 PM  [facility] [address] [place], [address] Phone: [phone]   Fax:  [phone]  Name: Austin Blankenship MRN: [id number] Date of Birth: 1985/11/21

## 2022-07-30 ENCOUNTER — Ambulatory Visit: Payer: 59 | Admitting: Occupational Therapy

## 2022-07-30 DIAGNOSIS — M79644 Pain in right finger(s): Secondary | ICD-10-CM

## 2022-07-30 DIAGNOSIS — M25531 Pain in right wrist: Secondary | ICD-10-CM

## 2022-07-30 DIAGNOSIS — M25641 Stiffness of right hand, not elsewhere classified: Secondary | ICD-10-CM

## 2022-07-30 DIAGNOSIS — R600 Localized edema: Secondary | ICD-10-CM

## 2022-07-30 DIAGNOSIS — L905 Scar conditions and fibrosis of skin: Secondary | ICD-10-CM

## 2022-07-30 DIAGNOSIS — M6281 Muscle weakness (generalized): Secondary | ICD-10-CM

## 2022-07-30 NOTE — Therapy (Signed)
[facility] [address], [place], [address] Phone: [phone]   Fax:  [phone]  Occupational Therapy Treatment  Patient Details  Name: Austin Blankenship MRN: [id number] Date of Birth: May 28, 1985 Referring Provider (OT): [place] PA   Encounter Date: 07/30/2022   OT End of Session - 07/30/22 1301     Visit Number 2    Number of Visits 24    Date for OT Re-Evaluation 10/14/22    OT Start Time 1201    OT Stop Time 1255    OT Time Calculation (min) 54 min    Activity Tolerance Patient tolerated treatment well;Patient limited by pain    Behavior During Therapy [facility] for tasks assessed/performed             Past Medical History:  Diagnosis Date   Medical history non-contributory     Past Surgical History:  Procedure Laterality Date   CARPAL TUNNEL RELEASE Right 06/28/2022   Procedure: CARPAL TUNNEL RELEASE;  Surgeon: [name], MD;  Location: [facility];  Service: Orthopedics;  Laterality: Right;   FRACTURE SURGERY     left leg.  age 72 or 62   OPEN REDUCTION INTERNAL FIXATION (ORIF) DISTAL RADIAL FRACTURE Right 06/28/2022   Procedure: OPEN REDUCTION INTERNAL FIXATION (ORIF) DISTAL RADIUS FRACTURE;  Surgeon: [name], MD;  Location: [facility];  Service: Orthopedics;  Laterality: Right;   TONSILLECTOMY     and Adenoids as child    There were no vitals filed for this visit.   Subjective Assessment - 07/30/22 1258     Subjective  I can see my fingers is better- my thumb moving better- tried some wrist exercises- my fingers still swelling including thumb    Pertinent History 07/12/22 ORTHo NOTE- Austin Blankenship is a 37 y.o. male who presents today for suture removal status post a open reduction internal fixation of right distal radius fracture in addition to a carpal tunnel release. The patient suffered a right distal radius fracture and underwent surgery with Dr. Rosita Kea on 06/28/2022. The  patient had a complex distal radius fracture which did require use of both the volar and dorsal plate. The patient also suffered acute carpal tunnel at the time the injury and did have to undergo a carpal tunnel release. The patient was evaluated for skin check and was placed in a Velcro wrist splint, he was instructed to begin using his hand and flexing and extending his fingers. The patient presents today with still moderate swelling throughout the right hand, he does still report a burning in the median nerve distribution but does feel that the sensation is improving. He has been working on trying to make a full fist. He has been nonweightbearing to the right upper extremity. The patient is taking hydrocodone as needed for discomfort. Pain score today is a 7 out of 10. 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 Yes    Pain Score 3     Pain Location Wrist   Digits   Pain Orientation Right    Pain Descriptors / Indicators Aching;Tightness;Sore;Burning;Pins and needles    Pain Type Surgical pain    Pain Onset 1 to 4 weeks ago    Pain Frequency Constant  [facility] OT Assessment - 07/30/22 0001       AROM   Right Forearm Pronation 85 Degrees    Right Forearm Supination 50 Degrees    Right Wrist Extension 5 Degrees    Right Wrist Flexion 30 Degrees    Right Wrist Radial Deviation 4 Degrees    Right Wrist Ulnar Deviation 12 Degrees      Right Hand AROM   R Thumb MCP 0-60 45 Degrees    R Thumb IP 0-80 30 Degrees    R Thumb Radial ABduction/ADduction 0-55 40    R Thumb Palmar ABduction/ADduction 0-45 48    R Index  MCP 0-90 75 Degrees   0   R Index PIP 0-100 45 Degrees   -30 ext   R Long  MCP 0-90 70 Degrees   0   R Long PIP 0-100 65 Degrees   -30 ext   R Ring  MCP 0-90 70 Degrees   0   R Ring PIP 0-100 80 Degrees   -25  ext   R Little  MCP 0-90 64 Degrees   0   R Little PIP 0-100 75 Degrees   -15 ext                 Making good progress in [facility] and PIP extention  As well as flexion Cont to be limited in stiffness digits And well as scars  Adhere and tender - tight     OT Treatments/Exercises (OP) - 07/30/22 0001       RUE Contrast Bath   Time 8 minutes    Comments prior to soft tissue and PROM digits            Done HEP and review  with patient and friend home exercises. Patient to do 3 times a day contrast using heating pads and cold pack Soft tissue massage for swelling as well as scar massage Done MC and CT spreads with thumb PA and RA AAROM done -and reviewed  Some light traction to digits  And joint mobs to DIP and PIP's   Cica -Care scar pad to all scars at nighttime Fitted with Isotoner glove under wrist splint most of the time to decrease edema Small to blue foam block at volar part of hand and palm to and support wrist in neutral and splint  Passive range of motion to digits as well as thumb at all joints 10 reps with active assisted range of motion to extension 10 reps REINFORCE PROM MC WITH PIP EXTENTION INTRINSIC FIST AND THEN COMPOSITE  Keep pain under 2/10   Tendon glides with facilitation active assisted range of motion for extension 10 reps AAROM on table ulnar radial deviation as well as active range of motion for supination pronation 10 reps each-add and review using supination wheel - 20 reps   Focus summary rolling right roller to facilitate digit extension as well as wrist in neutral on table 20 reps Opposition picking up 2 cm foam block alternating digits.  Patient can do second and third at the end of session fourth. Active assisted range of motion for thumb palmar radial abduction       OT Education - 07/30/22 1301     Education Details progress and HEP    Person(s) Educated Patient;Other (comment)    Methods Explanation;Demonstration;Tactile  cues;Verbal cues;Handout              OT Short Term Goals - 07/22/22 1330  OT SHORT TERM GOAL #1   Title Patient to be independent in home program to decrease edema and increase motion for pain and to be able to lay fingers and wrist straight on table.    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  Flexor contracture shoulder with pain with attempts of passive range of motion at PIP [name]  Unable to do palmar ABD- thumb resting more in palmar-coming in patient keep wrist in flexion even in brace.    Time 2    Period Weeks    Status New    Target Date 08/05/22      OT SHORT TERM GOAL #2   Title Patient demo understanding to wear wrist correctly and position right arm during the day correct to decrease flexion contractures in digits and  wrist    Baseline Patient wrist hand and wrist splint in flexion; fingers in flexion contractures at the MCP and PIPs-unable to straighten PIPs with passive and active range of motion    Time 3    Period Weeks    Status New    Target Date 08/12/22               OT Long Term Goals - 07/22/22 1333       OT LONG TERM GOAL #1   Title Digit flexion increased for patient to touch palm without increased symptoms to initiate using it in ADLs    Baseline MC flexion 55 to 60 degrees, PIP's 45 to 60 degrees -unable to use hand in any bathing or dressing or eating.    Time 4    Period Weeks    Status New    Target Date 08/19/22      OT LONG TERM GOAL #2   Title Right wrist active range of motion increased to within functional limits for patient to use right hand in 50% of ADLs    Baseline Right wrist extension 5 degrees and flexion 30 degrees, ulnar deviation 5 degrees and radial deviation 12-not using the right hand or wrist activities    Time 6    Period Weeks    Status New    Target Date 09/02/22      OT LONG TERM GOAL #3   Title Right forearm supination pronation increased to within functional limits for patient to turn  doorknob with minimal symptoms    Baseline Patient decreased greatly in supination 50 and pronation 70    Time 6    Period Weeks    Status New    Target Date 09/02/22      OT LONG TERM GOAL #4   Title Right digit extension as well as thumb palmar radial abduction within functional limits to put hand in pocket and don gloves with no issues    Baseline Palmar abduction 48 and radial abduction 40 degrees extension limited -10 to -15 at MC's, PIPs -30 to -50    Time 5    Period Weeks    Status New    Target Date 08/26/22      OT LONG TERM GOAL #5   Title Strength in right hand and wrist for patient to be able to carry 5 pounds with no increase symptoms, push and pull heavy door and initiate using tools    Baseline Patient 3-1/2 weeks postop.  Steri-Strips removed today-severe edema and stiffness in right hand and wrist.    Time 12    Period Weeks    Status New    Target  Date 10/14/22                   Plan - 07/30/22 1302     Clinical Impression Statement Patient presented OT evaluation 4 1/2 weeks postop right distal radius fracture with volar and dorsal plate, and carpal tunnel release by Dr. Rosita Kea 06/28/2022.  Patient  cont to have  severe stiffness and edema in the right  dominant hand and wrist.  Patient unable to straighten digits at PIP- MC's improved.  Flexion of digits limited  but improving at [facility]  and PIP's. Initiated scar massage and soft tissue for [facility] and CT spreads as well as digits extention , thumb PA and RA.    Edema decreasing but fitted with isotoner glove this date. Initiated AAROM for sup /pro using wheel and RD/UD.   Patient with pins-and-needles in thumb through fourth digits.  Patient limited severely in functional use of right hand and wrist in ADLs and IADLs.  Patient can benefit from skilled OT services to decrease edema and pain and increase motion and strength to return to prior level of function.    OT Occupational Profile and History Problem Focused  Assessment - Including review of records relating to presenting problem    Occupational performance deficits (Please refer to evaluation for details): ADL's;IADL's;Work;Play;Social Participation;Leisure    Body Structure / Function / Physical Skills ADL;Decreased knowledge of precautions;Flexibility;ROM;UE functional use;Scar mobility;FMC;Dexterity;Sensation;Strength;Pain;IADL;Edema;Coordination    Rehab Potential Fair    Clinical Decision Making Limited treatment options, no task modification necessary    Comorbidities Affecting Occupational Performance: None    Modification or Assistance to Complete Evaluation  No modification of tasks or assist necessary to complete eval    OT Frequency 2x / week    OT Duration 12 weeks    OT Treatment/Interventions Self-care/ADL training;Therapeutic exercise;Patient/family education;Splinting;Paraffin;Fluidtherapy;Contrast Bath;DME and/or AE instruction;Manual Therapy;Passive range of motion;Scar mobilization;Therapeutic activities    Consulted and Agree with Plan of Care Patient             Patient will benefit from skilled therapeutic intervention in order to improve the following deficits and impairments:   Body Structure / Function / Physical Skills: ADL, Decreased knowledge of precautions, Flexibility, ROM, UE functional use, Scar mobility, FMC, Dexterity, Sensation, Strength, Pain, IADL, Edema, Coordination       Visit Diagnosis: Localized edema  Stiffness of right hand, not elsewhere classified  Scar condition and fibrosis of skin  Pain in right finger(s)  Muscle weakness (generalized)  Pain in right wrist    Problem List There are no problems to display for this patient.   Oletta Cohn, OTR/L,CLT 07/30/2022, 1:10 PM  [facility] [address], [place], [address] Phone: [phone]   Fax:  [phone]  Name: KEAUN SPINELLI MRN: [id number] Date of Birth:  09-Jul-1985

## 2022-08-01 ENCOUNTER — Ambulatory Visit: Payer: 59 | Admitting: Occupational Therapy

## 2022-08-01 DIAGNOSIS — R600 Localized edema: Secondary | ICD-10-CM

## 2022-08-01 DIAGNOSIS — M25641 Stiffness of right hand, not elsewhere classified: Secondary | ICD-10-CM

## 2022-08-01 DIAGNOSIS — M25631 Stiffness of right wrist, not elsewhere classified: Secondary | ICD-10-CM

## 2022-08-01 DIAGNOSIS — M6281 Muscle weakness (generalized): Secondary | ICD-10-CM

## 2022-08-01 DIAGNOSIS — L905 Scar conditions and fibrosis of skin: Secondary | ICD-10-CM

## 2022-08-01 DIAGNOSIS — M79644 Pain in right finger(s): Secondary | ICD-10-CM

## 2022-08-01 NOTE — Therapy (Signed)
[facility] [address] [place], [address] Phone: [phone]   Fax:  [phone]  Occupational Therapy Treatment  Patient Details  Name: Austin Blankenship MRN: [id number] Date of Birth: 15-May-1985 Referring Provider (OT): Paradis PA   Encounter Date: 08/01/2022   OT End of Session - 08/01/22 1213     Visit Number 3    Number of Visits 24    Date for OT Re-Evaluation 10/14/22    OT Start Time 1212    OT Stop Time 1256    OT Time Calculation (min) 44 min    Activity Tolerance Patient tolerated treatment well;Patient limited by pain    Behavior During Therapy [facility] for tasks assessed/performed             Past Medical History:  Diagnosis Date   Medical history non-contributory     Past Surgical History:  Procedure Laterality Date   CARPAL TUNNEL RELEASE Right 06/28/2022   Procedure: CARPAL TUNNEL RELEASE;  Surgeon: [name], MD;  Location: [facility];  Service: Orthopedics;  Laterality: Right;   FRACTURE SURGERY     left leg.  age 62 or 85   OPEN REDUCTION INTERNAL FIXATION (ORIF) DISTAL RADIAL FRACTURE Right 06/28/2022   Procedure: OPEN REDUCTION INTERNAL FIXATION (ORIF) DISTAL RADIUS FRACTURE;  Surgeon: [name], MD;  Location: [facility];  Service: Orthopedics;  Laterality: Right;   TONSILLECTOMY     and Adenoids as child    There were no vitals filed for this visit.   Subjective Assessment - 08/01/22 1213     Subjective  No pain except when doing exercises - and the middle finger tightest- and cannot touch palm - glove helps but tight -doing rotation of my wrist- the glove do help for the swelling    Pertinent History 07/12/22 ORTHo NOTE- Austin Blankenship is a 37 y.o. male who presents today for suture removal status post a open reduction internal fixation of right distal radius fracture in addition to a carpal tunnel release. The patient suffered a right distal radius fracture  and underwent surgery with Dr. Rosita Kea on 06/28/2022. The patient had a complex distal radius fracture which did require use of both the volar and dorsal plate. The patient also suffered acute carpal tunnel at the time the injury and did have to undergo a carpal tunnel release. The patient was evaluated for skin check and was placed in a Velcro wrist splint, he was instructed to begin using his hand and flexing and extending his fingers. The patient presents today with still moderate swelling throughout the right hand, he does still report a burning in the median nerve distribution but does feel that the sensation is improving. He has been working on trying to make a full fist. He has been nonweightbearing to the right upper extremity. The patient is taking hydrocodone as needed for discomfort. Pain score today is a 7 out of 10. 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 No/denies                     Patient arrive with reports of not doing home program yet this morning. Patient with increased stiffness coming in wearing Isotoner glove and wrist splint      OT  Treatments/Exercises (OP) - 08/01/22 0001       RUE Contrast Bath   Time 8 minutes    Comments prior to soft tissue and ROM- with LMB splint for PIP ext 3rd and 4th - 2 min             Done HEP and review  with patient home exercises. Patient to do 3 times a day contrast using heating pads and cold pack Can use LMB splint for PIP extension during contrast reviewed with patient wearing and precautions  Soft tissue massage for swelling as well as scar massage Done MC and CT spreads with thumb PA and RA AAROM done -and reviewed  Some light traction to digits  And joint mobs to DIP and PIP's    Cica -Care scar pad to all scars at nighttime Applied this date Kinesiotape  out of 20% pull parallel to the scar And 2 across the 100% pull for scar mobilization during the day today. Continue Isotoner glove under wrist splint most of the time to decrease edema Small to blue foam block at volar part of hand and palm to and support wrist in neutral and splint   Passive range of motion to digits as well as thumb at all joints 10 reps with active assisted range of motion to extension 10 reps REINFORCE PROM MC WITH PIP EXTENTION INTRINSIC FIST AND THEN COMPOSITE  Keep pain under 2/10    Tendon glides with facilitation active assisted range of motion for extension 10 reps AAROM on table ulnar radial deviation as well as  AAROM using L hand  supination pronation  in splint 10 reps keeping pain under 2/10   Focus summary rolling right roller to facilitate digit extension as well as wrist in neutral on table 20 reps Opposition picking up 2 cm foam block alternating digits.  Patient can do second and third at the end of session fourth. Active assisted range of motion for thumb palmar radial abduction       OT Education - 08/01/22 1213     Education Details progress and HEP    Person(s) Educated Patient;Other (comment)    Methods Explanation;Demonstration;Tactile cues;Verbal cues;Handout    Comprehension Verbal cues required;Returned demonstration;Verbalized understanding              OT Short Term Goals - 07/22/22 1330       OT SHORT TERM GOAL #1   Title Patient to be independent in home program to decrease edema and increase motion for pain and to be able to lay fingers and wrist straight on table.    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  Flexor contracture shoulder with pain with attempts of passive range of motion at PIP [name]  Unable to do palmar ABD- thumb resting more in palmar-coming in patient keep wrist in flexion even in brace.    Time 2    Period Weeks    Status New    Target Date 08/05/22      OT SHORT TERM GOAL #2    Title Patient demo understanding to wear wrist correctly and position right arm during the day correct to decrease flexion contractures in digits and  wrist    Baseline Patient wrist hand and wrist splint in flexion; fingers in flexion contractures at the MCP and PIPs-unable to straighten PIPs with passive and active range of motion    Time 3    Period Weeks    Status New    Target Date  08/12/22               OT Long Term Goals - 07/22/22 1333       OT LONG TERM GOAL #1   Title Digit flexion increased for patient to touch palm without increased symptoms to initiate using it in ADLs    Baseline MC flexion 55 to 60 degrees, PIP's 45 to 60 degrees -unable to use hand in any bathing or dressing or eating.    Time 4    Period Weeks    Status New    Target Date 08/19/22      OT LONG TERM GOAL #2   Title Right wrist active range of motion increased to within functional limits for patient to use right hand in 50% of ADLs    Baseline Right wrist extension 5 degrees and flexion 30 degrees, ulnar deviation 5 degrees and radial deviation 12-not using the right hand or wrist activities    Time 6    Period Weeks    Status New    Target Date 09/02/22      OT LONG TERM GOAL #3   Title Right forearm supination pronation increased to within functional limits for patient to turn doorknob with minimal symptoms    Baseline Patient decreased greatly in supination 50 and pronation 70    Time 6    Period Weeks    Status New    Target Date 09/02/22      OT LONG TERM GOAL #4   Title Right digit extension as well as thumb palmar radial abduction within functional limits to put hand in pocket and don gloves with no issues    Baseline Palmar abduction 48 and radial abduction 40 degrees extension limited -10 to -15 at MC's, PIPs -30 to -50    Time 5    Period Weeks    Status New    Target Date 08/26/22      OT LONG TERM GOAL #5   Title Strength in right hand and wrist for patient to be able to  carry 5 pounds with no increase symptoms, push and pull heavy door and initiate using tools    Baseline Patient 3-1/2 weeks postop.  Steri-Strips removed today-severe edema and stiffness in right hand and wrist.    Time 12    Period Weeks    Status New    Target Date 10/14/22                   Plan - 08/01/22 1214     Clinical Impression Statement Patient presented OT evaluation 5 weeks postop right distal radius fracture with volar and dorsal plate, and carpal tunnel release by Dr. Rosita Kea 06/28/2022.  Patient  cont to have  severe stiffness and edema in the right  dominant hand and wrist.  Patient unable to straighten digits at PIP- MC's improved.  Flexion of digits limited  but improving at [facility]  more than DIP/PIP's. Scar massage and soft tissue for [facility] and CT spreads as well as digits extention , thumb PA and RA.    Edema still limiting motion -reviewed again using isotoner glove this date. Initiated AAROM for sup /pro using L hand - keeping pain under 2/10.   Patient with pins-and-needles in thumb through fourth digits.  Patient limited severely in functional use of right hand and wrist in ADLs and IADLs.  Patient can benefit from skilled OT services to decrease edema and pain and increase motion and strength to return  to prior level of function.    OT Occupational Profile and History Problem Focused Assessment - Including review of records relating to presenting problem    Occupational performance deficits (Please refer to evaluation for details): ADL's;IADL's;Work;Play;Social Participation;Leisure    Body Structure / Function / Physical Skills ADL;Decreased knowledge of precautions;Flexibility;ROM;UE functional use;Scar mobility;FMC;Dexterity;Sensation;Strength;Pain;IADL;Edema;Coordination    Rehab Potential Fair    Clinical Decision Making Limited treatment options, no task modification necessary    Comorbidities Affecting Occupational Performance: None    Modification or Assistance to  Complete Evaluation  No modification of tasks or assist necessary to complete eval    OT Frequency 2x / week    OT Duration 12 weeks    OT Treatment/Interventions Self-care/ADL training;Therapeutic exercise;Patient/family education;Splinting;Paraffin;Fluidtherapy;Contrast Bath;DME and/or AE instruction;Manual Therapy;Passive range of motion;Scar mobilization;Therapeutic activities    Consulted and Agree with Plan of Care Patient             Patient will benefit from skilled therapeutic intervention in order to improve the following deficits and impairments:   Body Structure / Function / Physical Skills: ADL, Decreased knowledge of precautions, Flexibility, ROM, UE functional use, Scar mobility, FMC, Dexterity, Sensation, Strength, Pain, IADL, Edema, Coordination       Visit Diagnosis: Localized edema  Stiffness of right hand, not elsewhere classified  Scar condition and fibrosis of skin  Muscle weakness (generalized)  Pain in right finger(s)  Stiffness of right wrist, not elsewhere classified    Problem List There are no problems to display for this patient.   Oletta Cohn, OTR/L,CLT 08/01/2022, 1:11 PM  [place] [facility] [address] [place], [address] Phone: [phone]   Fax:  [phone]  Name: ANDUIN KOPITZKE MRN: [id number] Date of Birth: [birth year]

## 2022-08-05 ENCOUNTER — Ambulatory Visit: Payer: 59 | Admitting: Occupational Therapy

## 2022-08-08 ENCOUNTER — Ambulatory Visit: Payer: 59 | Admitting: Occupational Therapy

## 2022-08-08 DIAGNOSIS — M79644 Pain in right finger(s): Secondary | ICD-10-CM

## 2022-08-08 DIAGNOSIS — M6281 Muscle weakness (generalized): Secondary | ICD-10-CM

## 2022-08-08 DIAGNOSIS — R600 Localized edema: Secondary | ICD-10-CM | POA: Diagnosis not present

## 2022-08-08 DIAGNOSIS — M25531 Pain in right wrist: Secondary | ICD-10-CM

## 2022-08-08 DIAGNOSIS — M25641 Stiffness of right hand, not elsewhere classified: Secondary | ICD-10-CM

## 2022-08-08 DIAGNOSIS — M25631 Stiffness of right wrist, not elsewhere classified: Secondary | ICD-10-CM

## 2022-08-08 DIAGNOSIS — L905 Scar conditions and fibrosis of skin: Secondary | ICD-10-CM

## 2022-08-08 NOTE — Therapy (Signed)
[facility] [address], [place], [address] Phone: [phone]   Fax:  [phone]  Occupational Therapy Treatment  Patient Details  Name: Austin Blankenship MRN: [id number] Date of Birth: 1986/02/11 Referring Provider (OT): [place] PA   Encounter Date: 08/08/2022   OT End of Session - 08/08/22 1230     Visit Number 4    Number of Visits 24    Date for OT Re-Evaluation 10/14/22    OT Start Time 1202    OT Stop Time 1259    OT Time Calculation (min) 57 min    Activity Tolerance Patient tolerated treatment well;Patient limited by pain    Behavior During Therapy [facility] for tasks assessed/performed             Past Medical History:  Diagnosis Date   Medical history non-contributory     Past Surgical History:  Procedure Laterality Date   CARPAL TUNNEL RELEASE Right 06/28/2022   Procedure: CARPAL TUNNEL RELEASE;  Surgeon: [name], MD;  Location: [facility];  Service: Orthopedics;  Laterality: Right;   FRACTURE SURGERY     left leg.  age 72 or 42   OPEN REDUCTION INTERNAL FIXATION (ORIF) DISTAL RADIAL FRACTURE Right 06/28/2022   Procedure: OPEN REDUCTION INTERNAL FIXATION (ORIF) DISTAL RADIUS FRACTURE;  Surgeon: [name], MD;  Location: [facility];  Service: Orthopedics;  Laterality: Right;   TONSILLECTOMY     and Adenoids as child    There were no vitals filed for this visit.   Subjective Assessment - 08/08/22 1227     Subjective  First 3 fingers still pins and needles- trying to use it more- first 2 fingers more stiff than other and thumb - bending the wrist back is the worse    Pertinent History 07/12/22 ORTHo NOTE- Austin Blankenship is a 37 y.o. male who presents today for suture removal status post a open reduction internal fixation of right distal radius fracture in addition to a carpal tunnel release. The patient suffered a right distal radius fracture and underwent surgery with Dr.  Rosita Kea on 06/28/2022. The patient had a complex distal radius fracture which did require use of both the volar and dorsal plate. The patient also suffered acute carpal tunnel at the time the injury and did have to undergo a carpal tunnel release. The patient was evaluated for skin check and was placed in a Velcro wrist splint, he was instructed to begin using his hand and flexing and extending his fingers. The patient presents today with still moderate swelling throughout the right hand, he does still report a burning in the median nerve distribution but does feel that the sensation is improving. He has been working on trying to make a full fist. He has been nonweightbearing to the right upper extremity. The patient is taking hydrocodone as needed for discomfort. Pain score today is a 7 out of 10. 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 Yes    Pain Score 3     Pain Location Wrist    Pain Orientation Right    Pain Descriptors / Indicators Tightness;Burning;Aching;Shooting    Pain Type Surgical pain                [facility] OT Assessment - 08/08/22  0001       AROM   Right Forearm Pronation 90 Degrees    Right Forearm Supination 40 Degrees    Right Wrist Extension 8 Degrees    Right Wrist Flexion 40 Degrees    Right Wrist Radial Deviation 12 Degrees    Right Wrist Ulnar Deviation 14 Degrees      Right Hand AROM   R Thumb MCP 0-60 50 Degrees    R Thumb IP 0-80 30 Degrees    R Thumb Radial ABduction/ADduction 0-55 50    R Thumb Palmar ABduction/ADduction 0-45 52    R Index  MCP 0-90 75 Degrees    R Index PIP 0-100 52 Degrees   -20 ext   R Long  MCP 0-90 80 Degrees    R Long PIP 0-100 80 Degrees   -25 ext   R Ring  MCP 0-90 80 Degrees    R Ring PIP 0-100 95 Degrees   -15 ext   R Little  MCP 0-90 80 Degrees    R Little PIP 0-100 75  Degrees   -5 ext             Cont to make progress in digits and wrist - slow but steady         OT Treatments/Exercises (OP) - 08/08/22 0001       Moist Heat Therapy   Number Minutes Moist Heat 8 Minutes    Moist Heat Location --   LMB on 3rd and 4th - sup stretch - 2 min at time           Done HEP and review  with patient home exercises inbetween Patient to do 3 times a day contrast using heating pads and cold pack Can use LMB splint for PIP extension during contrast  as well as supination stretch using heat  reviewed with patient wearing and precautions   Soft tissue massage for swelling as well as scar massage Done MC and CT spreads with thumb PA and RA AAROM done -and reviewed  Some light traction to digits  And joint mobs to DIP and PIP's    New Cica -Care scar pad t issued to all scars at nighttime Pt to use cica scar pad for scar massage -keeping wrist in slight extention  Continue Isotoner glove under wrist splint if decreasing edema  Small to blue foam block at volar part of hand and palm to and support wrist in neutral and splint   Passive range of motion to digits as well as thumb at all joints 10 reps with active assisted range of motion to extension 10 reps REINFORCE PROM MC WITH PIP EXTENTION INTRINSIC FIST - PROM DONE THIS DATE and reviewed  AND THEN COMPOSITE to foam roller  Place and hold large and then smaller roller  To OT thumb out of palm  Keep pain under 2/10    PROM done on edge of table ulnar /radial deviation  And add /done flexion, extention this date  After CPM for wrist extention 2 x 200 sec- increase to 14 degrees  Cont with  AAROM using L hand  supination pronation  in splint 10 reps keeping pain under 2/10 And AROM for wrist in all planes    Roll R hand and wrist over foam roller to facilitate digit extension as well as wrist in neutral on table 20 reps Opposition picking up 2 cm foam block alternating digits.  Patient can do  opposition to 2nd thru 4th -  1 cm lagging to 5th            OT Education - 08/08/22 1229     Education Details progress and HEP    Person(s) Educated Patient;Other (comment)    Methods Explanation;Demonstration;Tactile cues;Verbal cues;Handout    Comprehension Verbal cues required;Returned demonstration;Verbalized understanding              OT Short Term Goals - 07/22/22 1330       OT SHORT TERM GOAL #1   Title Patient to be independent in home program to decrease edema and increase motion for pain and to be able to lay fingers and wrist straight on table.    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  Flexor contracture shoulder with pain with attempts of passive range of motion at PIP [name]  Unable to do palmar ABD- thumb resting more in palmar-coming in patient keep wrist in flexion even in brace.    Time 2    Period Weeks    Status New    Target Date 08/05/22      OT SHORT TERM GOAL #2   Title Patient demo understanding to wear wrist correctly and position right arm during the day correct to decrease flexion contractures in digits and  wrist    Baseline Patient wrist hand and wrist splint in flexion; fingers in flexion contractures at the MCP and PIPs-unable to straighten PIPs with passive and active range of motion    Time 3    Period Weeks    Status New    Target Date 08/12/22               OT Long Term Goals - 07/22/22 1333       OT LONG TERM GOAL #1   Title Digit flexion increased for patient to touch palm without increased symptoms to initiate using it in ADLs    Baseline MC flexion 55 to 60 degrees, PIP's 45 to 60 degrees -unable to use hand in any bathing or dressing or eating.    Time 4    Period Weeks    Status New    Target Date 08/19/22      OT LONG TERM GOAL #2   Title Right wrist active range of motion increased to within functional limits for patient to use right hand in 50% of ADLs    Baseline Right wrist extension 5  degrees and flexion 30 degrees, ulnar deviation 5 degrees and radial deviation 12-not using the right hand or wrist activities    Time 6    Period Weeks    Status New    Target Date 09/02/22      OT LONG TERM GOAL #3   Title Right forearm supination pronation increased to within functional limits for patient to turn doorknob with minimal symptoms    Baseline Patient decreased greatly in supination 50 and pronation 70    Time 6    Period Weeks    Status New    Target Date 09/02/22      OT LONG TERM GOAL #4   Title Right digit extension as well as thumb palmar radial abduction within functional limits to put hand in pocket and don gloves with no issues    Baseline Palmar abduction 48 and radial abduction 40 degrees extension limited -10 to -15 at [facility], PIPs -30 to -50    Time 5    Period Weeks    Status New    Target Date 08/26/22  OT LONG TERM GOAL #5   Title Strength in right hand and wrist for patient to be able to carry 5 pounds with no increase symptoms, push and pull heavy door and initiate using tools    Baseline Patient 3-1/2 weeks postop.  Steri-Strips removed today-severe edema and stiffness in right hand and wrist.    Time 12    Period Weeks    Status New    Target Date 10/14/22                   Plan - 08/08/22 1230     Clinical Impression Statement Patient presented OT evaluation with diagnosis of s/p  right distal radius fracture with volar and dorsal plate, and carpal tunnel release by Dr. Rosita Kea 06/28/2022.  Patient  cont to have  severe stiffness and edema in the right  dominant hand and wrist.  Patient unable to straighten digits at PIP but improving- MC's extention WNL now.  Flexion of digits limited  but improving at [facility]  more than DIP/PIP's. Scar massage and soft tissue for [facility] and CT spreads as well as digits extention , thumb PA and RA.  Reinforce scar mobs and composite digits extention, PROM flexion of DIP/PIP's and thumb flexoin.    Edema improving  but still limiting motion. Initiated AAROM for sup /pro using L hand - keeping pain under 2/10.   Patient with pins-and-needles in thumb through fourth digits. This date initiated PROM wrist flexion/ext and cont with RD/UD on edge of table.   Patient limited severely in functional use of right hand and wrist in ADLs and IADLs.  Patient can benefit from skilled OT services to decrease edema and pain and increase motion and strength to return to prior level of function.    OT Occupational Profile and History Problem Focused Assessment - Including review of records relating to presenting problem    Occupational performance deficits (Please refer to evaluation for details): ADL's;IADL's;Work;Play;Social Participation;Leisure    Body Structure / Function / Physical Skills ADL;Decreased knowledge of precautions;Flexibility;ROM;UE functional use;Scar mobility;FMC;Dexterity;Sensation;Strength;Pain;IADL;Edema;Coordination    Rehab Potential Fair    Clinical Decision Making Limited treatment options, no task modification necessary    Comorbidities Affecting Occupational Performance: None    Modification or Assistance to Complete Evaluation  No modification of tasks or assist necessary to complete eval    OT Frequency 2x / week    OT Duration 12 weeks    OT Treatment/Interventions Self-care/ADL training;Therapeutic exercise;Patient/family education;Splinting;Paraffin;Fluidtherapy;Contrast Bath;DME and/or AE instruction;Manual Therapy;Passive range of motion;Scar mobilization;Therapeutic activities    Consulted and Agree with Plan of Care Patient             Patient will benefit from skilled therapeutic intervention in order to improve the following deficits and impairments:   Body Structure / Function / Physical Skills: ADL, Decreased knowledge of precautions, Flexibility, ROM, UE functional use, Scar mobility, FMC, Dexterity, Sensation, Strength, Pain, IADL, Edema, Coordination       Visit  Diagnosis: Localized edema  Stiffness of right hand, not elsewhere classified  Scar condition and fibrosis of skin  Muscle weakness (generalized)  Pain in right finger(s)  Pain in right wrist  Stiffness of right wrist, not elsewhere classified    Problem List There are no problems to display for this patient.   Oletta Cohn, OTR/L,CLT 08/08/2022, 1:18 PM  [facility] [address], [place], [address] Phone: [phone]   Fax:  [phone]  Name: Austin  LUCY Blankenship MRN: [id number] Date of Birth: Oct 11, 1985

## 2022-08-12 ENCOUNTER — Ambulatory Visit: Payer: 59 | Admitting: Occupational Therapy

## 2022-08-12 DIAGNOSIS — R600 Localized edema: Secondary | ICD-10-CM | POA: Diagnosis not present

## 2022-08-12 DIAGNOSIS — M6281 Muscle weakness (generalized): Secondary | ICD-10-CM

## 2022-08-12 DIAGNOSIS — M25641 Stiffness of right hand, not elsewhere classified: Secondary | ICD-10-CM

## 2022-08-12 DIAGNOSIS — L905 Scar conditions and fibrosis of skin: Secondary | ICD-10-CM

## 2022-08-12 DIAGNOSIS — M25631 Stiffness of right wrist, not elsewhere classified: Secondary | ICD-10-CM

## 2022-08-12 DIAGNOSIS — M25531 Pain in right wrist: Secondary | ICD-10-CM

## 2022-08-12 DIAGNOSIS — M79644 Pain in right finger(s): Secondary | ICD-10-CM

## 2022-08-12 NOTE — Therapy (Signed)
[facility] [address], [place], [address] Phone: [phone]   Fax:  [phone]  Occupational Therapy Treatment  Patient Details  Name: Austin Blankenship MRN: [id number] Date of Birth: 16-Oct-1985 Referring Provider (OT): [place] PA   Encounter Date: 08/12/2022   OT End of Session - 08/12/22 1031     Visit Number 5    Number of Visits 24    Date for OT Re-Evaluation 10/14/22    OT Start Time 1031    OT Stop Time 1117    OT Time Calculation (min) 46 min    Activity Tolerance Patient tolerated treatment well;Patient limited by pain    Behavior During Therapy [facility] for tasks assessed/performed             Past Medical History:  Diagnosis Date   Medical history non-contributory     Past Surgical History:  Procedure Laterality Date   CARPAL TUNNEL RELEASE Right 06/28/2022   Procedure: CARPAL TUNNEL RELEASE;  Surgeon: [name], MD;  Location: [facility];  Service: Orthopedics;  Laterality: Right;   FRACTURE SURGERY     left leg.  age 60 or 67   OPEN REDUCTION INTERNAL FIXATION (ORIF) DISTAL RADIAL FRACTURE Right 06/28/2022   Procedure: OPEN REDUCTION INTERNAL FIXATION (ORIF) DISTAL RADIUS FRACTURE;  Surgeon: [name], MD;  Location: [facility];  Service: Orthopedics;  Laterality: Right;   TONSILLECTOMY     and Adenoids as child    There were no vitals filed for this visit.   Subjective Assessment - 08/12/22 1031     Subjective  Trying to do more light stuff at home- working my fingers - after working it I can nearly touch my palm- been working my wrist - wearing hard splint 60% - pins and needles getting better    Pertinent History 07/12/22 ORTHo NOTE- Austin Blankenship is a 37 y.o. male who presents today for suture removal status post a open reduction internal fixation of right distal radius fracture in addition to a carpal tunnel release. The patient suffered a right distal radius  fracture and underwent surgery with Dr. Rosita Kea on 06/28/2022. The patient had a complex distal radius fracture which did require use of both the volar and dorsal plate. The patient also suffered acute carpal tunnel at the time the injury and did have to undergo a carpal tunnel release. The patient was evaluated for skin check and was placed in a Velcro wrist splint, he was instructed to begin using his hand and flexing and extending his fingers. The patient presents today with still moderate swelling throughout the right hand, he does still report a burning in the median nerve distribution but does feel that the sensation is improving. He has been working on trying to make a full fist. He has been nonweightbearing to the right upper extremity. The patient is taking hydrocodone as needed for discomfort. Pain score today is a 7 out of 10. 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 Yes    Pain Score 2     Pain Location Hand    Pain Orientation Right    Pain Descriptors / Indicators Aching;Tightness    Pain Type Surgical pain  [facility] OT Assessment - 08/12/22 0001       AROM   Right Forearm Pronation 90 Degrees    Right Forearm Supination 55 Degrees    Right Wrist Extension 18 Degrees    Right Wrist Flexion 45 Degrees    Right Wrist Radial Deviation 13 Degrees    Right Wrist Ulnar Deviation 23 Degrees      Right Hand AROM   R Thumb MCP 0-60 50 Degrees    R Thumb IP 0-80 35 Degrees    R Thumb Radial ABduction/ADduction 0-55 45    R Thumb Palmar ABduction/ADduction 0-45 55    R Index  MCP 0-90 75 Degrees    R Index PIP 0-100 75 Degrees   -20   R Long  MCP 0-90 80 Degrees    R Long PIP 0-100 85 Degrees   -25   R Ring  MCP 0-90 85 Degrees    R Ring PIP 0-100 95 Degrees   -15   R Little  MCP 0-90 85 Degrees    R Little PIP  0-100 80 Degrees   -5             Patient continues to make slow but steady progress in edema and pain as well as range of motion in digits flexion and extension as well as wrist. Patient very motivated and trying to do some light functional activities.        OT Treatments/Exercises (OP) - 08/12/22 0001       Moist Heat Therapy   Number Minutes Moist Heat 8 Minutes    Moist Heat Location --   LMB splint on 3rd PIP - sup stretch 2 x 2 min            Patient to do 2-3 times a day contrast using heating pads and cold pack Can use LMB splint for PIP extension on 3rd and 4th during contrast  as well as supination stretch using heat  reviewed with patient wearing and precautions   Soft tissue massage for swelling as well as scar massage Done MC and CT spreads with thumb PA and RA AAROM done -and reviewed  Some light traction to digits  And joint mobs to DIP and PIP's of all digits    Pt to use cica scar pad for scar massage if needed  -keeping wrist in slight extention  Wear cica scar pad night time    Reinforce extention of digits rolling over red roller and on table = pressure behind PIP's  Inbetween tendon glides and flexion of digits PROM to DIP and PIP flexion prior to  INTRINSIC FIST - PROM  AND THEN COMPOSITE to 2 cm foam block out of palm Place and hold to roller and then high lighter Extention inbetween Keep pain under 2/10    PROM done on edge of table ulnar /radial deviation  flexion, extention 12 reps done and pt to cont at home  CPM for wrist extention 2 x 200 sec- increase to 23 degrees  Followed by AROM wrist exention - open hand and loose fist 12 reps    Opposition - thumb IP and MC flexion PROM 12 reps  Prior to oppositoin to all digits  12 reps         OT Education - 08/12/22 1031     Education Details progress and HEP    Person(s) Educated Patient;Other (comment)    Methods Explanation;Demonstration;Tactile cues;Verbal cues;Handout     Comprehension Verbal cues required;Returned demonstration;Verbalized  understanding              OT Short Term Goals - 07/22/22 1330       OT SHORT TERM GOAL #1   Title Patient to be independent in home program to decrease edema and increase motion for pain and to be able to lay fingers and wrist straight on table.    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  Flexor contracture shoulder with pain with attempts of passive range of motion at PIP [name]  Unable to do palmar ABD- thumb resting more in palmar-coming in patient keep wrist in flexion even in brace.    Time 2    Period Weeks    Status New    Target Date 08/05/22      OT SHORT TERM GOAL #2   Title Patient demo understanding to wear wrist correctly and position right arm during the day correct to decrease flexion contractures in digits and  wrist    Baseline Patient wrist hand and wrist splint in flexion; fingers in flexion contractures at the MCP and PIPs-unable to straighten PIPs with passive and active range of motion    Time 3    Period Weeks    Status New    Target Date 08/12/22               OT Long Term Goals - 07/22/22 1333       OT LONG TERM GOAL #1   Title Digit flexion increased for patient to touch palm without increased symptoms to initiate using it in ADLs    Baseline MC flexion 55 to 60 degrees, PIP's 45 to 60 degrees -unable to use hand in any bathing or dressing or eating.    Time 4    Period Weeks    Status New    Target Date 08/19/22      OT LONG TERM GOAL #2   Title Right wrist active range of motion increased to within functional limits for patient to use right hand in 50% of ADLs    Baseline Right wrist extension 5 degrees and flexion 30 degrees, ulnar deviation 5 degrees and radial deviation 12-not using the right hand or wrist activities    Time 6    Period Weeks    Status New    Target Date 09/02/22      OT LONG TERM GOAL #3   Title Right forearm supination pronation  increased to within functional limits for patient to turn doorknob with minimal symptoms    Baseline Patient decreased greatly in supination 50 and pronation 70    Time 6    Period Weeks    Status New    Target Date 09/02/22      OT LONG TERM GOAL #4   Title Right digit extension as well as thumb palmar radial abduction within functional limits to put hand in pocket and don gloves with no issues    Baseline Palmar abduction 48 and radial abduction 40 degrees extension limited -10 to -15 at MC's, PIPs -30 to -50    Time 5    Period Weeks    Status New    Target Date 08/26/22      OT LONG TERM GOAL #5   Title Strength in right hand and wrist for patient to be able to carry 5 pounds with no increase symptoms, push and pull heavy door and initiate using tools    Baseline Patient 3-1/2 weeks postop.  Steri-Strips removed today-severe  edema and stiffness in right hand and wrist.    Time 12    Period Weeks    Status New    Target Date 10/14/22                   Plan - 08/12/22 1031     Clinical Impression Statement Patient presented OT evaluation with diagnosis of s/p  right distal radius fracture with volar and dorsal plate, and carpal tunnel release by Dr. Rosita Kea 06/28/2022.  Patient  cont to make slow but steady progress in decrease edema and pain - and increase ROM in digits  in R  dominant hand and wrist. Wrist AAROM/PROM initiate last week - pt unable to straighten digits at PIP but improving- MC's extention WNL now.  Flexion of digits improving with thumb, 2nd and 3rd the worse.Pt to focus on PIP/DIP flexion prior to composite.   Reinforce scar massage, DIP /PIP flexion , composite digits flexion/ extention. as well as thumb.    Edema improving but still limiting motion.  Wrist extention and supination limited the most - but all improving. Pt to keep pain under 2/10.   Patient  sensation improving in thumb thru 4th.   Patient limited severely in functional use of right hand and wrist  in ADLs and IADLs.  Patient can benefit from skilled OT services to decrease edema and pain and increase motion and strength to return to prior level of function.    OT Occupational Profile and History Problem Focused Assessment - Including review of records relating to presenting problem    Occupational performance deficits (Please refer to evaluation for details): ADL's;IADL's;Work;Play;Social Participation;Leisure    Body Structure / Function / Physical Skills ADL;Decreased knowledge of precautions;Flexibility;ROM;UE functional use;Scar mobility;FMC;Dexterity;Sensation;Strength;Pain;IADL;Edema;Coordination    Rehab Potential Fair    Clinical Decision Making Limited treatment options, no task modification necessary    Comorbidities Affecting Occupational Performance: None    Modification or Assistance to Complete Evaluation  No modification of tasks or assist necessary to complete eval    OT Frequency 2x / week    OT Duration 12 weeks    OT Treatment/Interventions Self-care/ADL training;Therapeutic exercise;Patient/family education;Splinting;Paraffin;Fluidtherapy;Contrast Bath;DME and/or AE instruction;Manual Therapy;Passive range of motion;Scar mobilization;Therapeutic activities    Consulted and Agree with Plan of Care Patient             Patient will benefit from skilled therapeutic intervention in order to improve the following deficits and impairments:   Body Structure / Function / Physical Skills: ADL, Decreased knowledge of precautions, Flexibility, ROM, UE functional use, Scar mobility, FMC, Dexterity, Sensation, Strength, Pain, IADL, Edema, Coordination       Visit Diagnosis: Localized edema  Stiffness of right hand, not elsewhere classified  Muscle weakness (generalized)  Pain in right finger(s)  Scar condition and fibrosis of skin  Stiffness of right wrist, not elsewhere classified  Pain in right wrist    Problem List There are no problems to display for this  patient.   Oletta Cohn, OTR/L,CLT 08/12/2022, 1:12 PM  [place] [facility] [address] [place], [address] Phone: [phone]   Fax:  [phone]  Name: Austin Blankenship MRN: [id number] Date of Birth: 1985-09-17

## 2022-08-15 ENCOUNTER — Ambulatory Visit: Payer: 59 | Admitting: Occupational Therapy

## 2022-08-15 DIAGNOSIS — M25631 Stiffness of right wrist, not elsewhere classified: Secondary | ICD-10-CM

## 2022-08-15 DIAGNOSIS — R600 Localized edema: Secondary | ICD-10-CM | POA: Diagnosis not present

## 2022-08-15 DIAGNOSIS — M79644 Pain in right finger(s): Secondary | ICD-10-CM

## 2022-08-15 DIAGNOSIS — M25531 Pain in right wrist: Secondary | ICD-10-CM

## 2022-08-15 DIAGNOSIS — M6281 Muscle weakness (generalized): Secondary | ICD-10-CM

## 2022-08-15 DIAGNOSIS — M25641 Stiffness of right hand, not elsewhere classified: Secondary | ICD-10-CM

## 2022-08-15 NOTE — Therapy (Signed)
[facility] [address], [place], [address] Phone: [phone]   Fax:  [phone]  Occupational Therapy Treatment  Patient Details  Name: Austin Blankenship MRN: [id number] Date of Birth: 05-28-1985 Referring Provider (OT): [place] PA   Encounter Date: 08/15/2022   OT End of Session - 08/15/22 1305     Visit Number 6    Number of Visits 24    Date for OT Re-Evaluation 10/14/22    OT Start Time 1201    OT Stop Time 1252    OT Time Calculation (min) 51 min    Activity Tolerance Patient tolerated treatment well;Patient limited by pain    Behavior During Therapy [facility] for tasks assessed/performed             Past Medical History:  Diagnosis Date   Medical history non-contributory     Past Surgical History:  Procedure Laterality Date   CARPAL TUNNEL RELEASE Right 06/28/2022   Procedure: CARPAL TUNNEL RELEASE;  Surgeon: Austin Bucker, MD;  Location: [facility];  Service: Orthopedics;  Laterality: Right;   FRACTURE SURGERY     left leg.  age 66 or 70   OPEN REDUCTION INTERNAL FIXATION (ORIF) DISTAL RADIAL FRACTURE Right 06/28/2022   Procedure: OPEN REDUCTION INTERNAL FIXATION (ORIF) DISTAL RADIUS FRACTURE;  Surgeon: Austin Bucker, MD;  Location: [facility];  Service: Orthopedics;  Laterality: Right;   TONSILLECTOMY     and Adenoids as child    There were no vitals filed for this visit.   Subjective Assessment - 08/15/22 1203     Subjective  I seen the Dr - can stop wearing my wrist splint - swelling still there and then  wrist and fingers stiff    Pertinent History 07/12/22 ORTHo NOTE- Austin Blankenship is a 37 y.o. male who presents today for suture removal status post a open reduction internal fixation of right distal radius fracture in addition to a carpal tunnel release. The patient suffered a right distal radius fracture and underwent surgery with Dr. Rosita Blankenship on 06/28/2022. The patient had a  complex distal radius fracture which did require use of both the volar and dorsal plate. The patient also suffered acute carpal tunnel at the time the injury and did have to undergo a carpal tunnel release. The patient was evaluated for skin check and was placed in a Velcro wrist splint, he was instructed to begin using his hand and flexing and extending his fingers. The patient presents today with still moderate swelling throughout the right hand, he does still report a burning in the median nerve distribution but does feel that the sensation is improving. He has been working on trying to make a full fist. He has been nonweightbearing to the right upper extremity. The patient is taking hydrocodone as needed for discomfort. Pain score today is a 7 out of 10. 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 Yes    Pain Score 2     Pain Location --   wrist and hand   Pain Orientation Right    Pain Descriptors / Indicators Tightness;Aching    Pain Type Surgical pain    Pain Onset More than a month ago  [facility] OT Assessment - 08/15/22 0001       AROM   Right Forearm Supination 60 Degrees    Right Wrist Extension 15 Degrees    Right Wrist Flexion 50 Degrees    Right Wrist Radial Deviation 15 Degrees    Right Wrist Ulnar Deviation 13 Degrees      Strength   Right Hand Grip (lbs) 2    Right Hand Lateral Pinch 4 lbs    Right Hand 3 Point Pinch 4 lbs    Left Hand Grip (lbs) 85    Left Hand Lateral Pinch 26 lbs    Left Hand 3 Point Pinch 23 lbs      Right Hand AROM   R Index  MCP 0-90 80 Degrees    R Little PIP 0-100 90 Degrees              Patient arrived with continued increased edema increased stiffness in digits and wrist.  Patient unable to wear compression glove per patient too much compression. Upon further  assessment patient report not doing contrast the last few sessions. Reinforced with patient the importance of contrast as well as scar massage. Also wearing Cica -Care scar pad provided patient with a Tubigrip F for not too tight compression Patient seen orthopedics did not wear wrist splint during the day since then. Because of edema as well as tightness in the flexors and scar tissue patient in a wrist flexion contracture position. Since last visit patient lost radial ulnar deviation as well as extension. Recommend for patient to wear 2 hours on 2 hours off to decrease risk for wrist flexor contracture.        OT Treatments/Exercises (OP) - 08/15/22 0001       RUE Fluidotherapy   Number Minutes Fluidotherapy 8 Minutes    RUE Fluidotherapy Location Wrist;Hand    Comments AROM for diigts and wrist - 2 x 1 min ice decrease edema             Patient to do 2-3 times a day contrast using heating pads and cold pack Can use LMB splint for PIP extension on 3rd and 4th during contrast  as well as supination stretch using heat  reviewed with patient wearing and precautions   Soft tissue massage for swelling as well as scar massage Done MC and CT spreads with thumb PA and RA AAROM done -and reviewed  Some light traction to digits  And joint mobs to DIP and PIP's of all digits     Pt to use cica scar pad for scar massage if needed  -keeping wrist in slight extention  Wear cica scar pad night time     Reinforce extention of digits rolling over red roller and on table = pressure behind PIP's  Inbetween tendon glides and flexion of digits PROM to DIP and PIP flexion prior to  INTRINSIC FIST - PROM  AND THEN COMPOSITE to 2 cm foam block out of palm Place and hold to roller and then high lighter Extention inbetween Keep pain under 2/10    PROM done on edge of table ulnar /radial deviation reviewed with patient by OT flexion, extention 12 reps Dumba reps for extension compared to  flexion. CPM for wrist extention 2 x 200 sec- increase to 32 degrees  Followed by AROM wrist exention - open hand and loose fist 12 reps    Opposition - thumb IP and MC flexion PROM 12 reps  Prior to oppositoin to all digits  Knees placing hold to fifth 12 reps         OT Education - 08/15/22 1305     Education Details progress and HEP    Person(s) Educated Patient;Other (comment)    Methods Explanation;Demonstration;Tactile cues;Verbal cues;Handout    Comprehension Verbal cues required;Returned demonstration;Verbalized understanding              OT Short Term Goals - 07/22/22 1330       OT SHORT TERM GOAL #1   Title Patient to be independent in home program to decrease edema and increase motion for pain and to be able to lay fingers and wrist straight on table.    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  Flexor contracture shoulder with pain with attempts of passive range of motion at PIP [name]  Unable to do palmar ABD- thumb resting more in palmar-coming in patient keep wrist in flexion even in brace.    Time 2    Period Weeks    Status New    Target Date 08/05/22      OT SHORT TERM GOAL #2   Title Patient demo understanding to wear wrist correctly and position right arm during the day correct to decrease flexion contractures in digits and  wrist    Baseline Patient wrist hand and wrist splint in flexion; fingers in flexion contractures at the MCP and PIPs-unable to straighten PIPs with passive and active range of motion    Time 3    Period Weeks    Status New    Target Date 08/12/22               OT Long Term Goals - 07/22/22 1333       OT LONG TERM GOAL #1   Title Digit flexion increased for patient to touch palm without increased symptoms to initiate using it in ADLs    Baseline MC flexion 55 to 60 degrees, PIP's 45 to 60 degrees -unable to use hand in any bathing or dressing or eating.    Time 4    Period Weeks    Status New     Target Date 08/19/22      OT LONG TERM GOAL #2   Title Right wrist active range of motion increased to within functional limits for patient to use right hand in 50% of ADLs    Baseline Right wrist extension 5 degrees and flexion 30 degrees, ulnar deviation 5 degrees and radial deviation 12-not using the right hand or wrist activities    Time 6    Period Weeks    Status New    Target Date 09/02/22      OT LONG TERM GOAL #3   Title Right forearm supination pronation increased to within functional limits for patient to turn doorknob with minimal symptoms    Baseline Patient decreased greatly in supination 50 and pronation 70    Time 6    Period Weeks    Status New    Target Date 09/02/22      OT LONG TERM GOAL #4   Title Right digit extension as well as thumb palmar radial abduction within functional limits to put hand in pocket and don gloves with no issues    Baseline Palmar abduction 48 and radial abduction 40 degrees extension limited -10 to -15 at [facility], PIPs -30 to -50    Time 5    Period Weeks    Status New    Target Date 08/26/22  OT LONG TERM GOAL #5   Title Strength in right hand and wrist for patient to be able to carry 5 pounds with no increase symptoms, push and pull heavy door and initiate using tools    Baseline Patient 3-1/2 weeks postop.  Steri-Strips removed today-severe edema and stiffness in right hand and wrist.    Time 12    Period Weeks    Status New    Target Date 10/14/22                   Plan - 08/15/22 1306     Clinical Impression Statement Patient presented OT evaluation with diagnosis of s/p  right distal radius fracture with volar and dorsal plate, and carpal tunnel release by Dr. Rosita Blankenship 06/28/2022.  Patient  cont to make slow but steady progress in decrease edema and pain - and increase ROM in digits  in R  dominant hand and wrist.Pt admit not doing contrast at home -will start back doing it prior to his ROM HEP. Reinforce again and doing  scar mobs. Reviewed AAROM on table for wrist flexion, ext and RD, UD - pt not wearing splint since last appt with surgeon but pt lost wrist extention - pt to wear splint 2 hrs on and off - to decrease risk for wrist flexion contracture. Pt  unable to straighten digits at PIP but improving- MC's extention WNL now.  Flexion of digits improving with thumb, 2nd  the worse. Pt to focus on PIP/DIP flexion prior to composite.   Reinforce scar massage, DIP /PIP flexion , composite digits flexion/ extention. as well as thumb.   Pt to keep pain under 2/10.   Patient  sensation improving in thumb thru 4th.   Patient limited severely in functional use of right hand and wrist in ADLs and IADLs.  Patient can benefit from skilled OT services to decrease edema and pain and increase motion and strength to return to prior level of function.    OT Occupational Profile and History Problem Focused Assessment - Including review of records relating to presenting problem    Occupational performance deficits (Please refer to evaluation for details): ADL's;IADL's;Work;Play;Social Participation;Leisure    Body Structure / Function / Physical Skills ADL;Decreased knowledge of precautions;Flexibility;ROM;UE functional use;Scar mobility;FMC;Dexterity;Sensation;Strength;Pain;IADL;Edema;Coordination    Rehab Potential Fair    Clinical Decision Making Limited treatment options, no task modification necessary    Comorbidities Affecting Occupational Performance: None    Modification or Assistance to Complete Evaluation  No modification of tasks or assist necessary to complete eval    OT Frequency 2x / week    OT Duration 12 weeks    OT Treatment/Interventions Self-care/ADL training;Therapeutic exercise;Patient/family education;Splinting;Paraffin;Fluidtherapy;Contrast Bath;DME and/or AE instruction;Manual Therapy;Passive range of motion;Scar mobilization;Therapeutic activities    Consulted and Agree with Plan of Care Patient              Patient will benefit from skilled therapeutic intervention in order to improve the following deficits and impairments:   Body Structure / Function / Physical Skills: ADL, Decreased knowledge of precautions, Flexibility, ROM, UE functional use, Scar mobility, FMC, Dexterity, Sensation, Strength, Pain, IADL, Edema, Coordination       Visit Diagnosis: Localized edema  Stiffness of right hand, not elsewhere classified  Muscle weakness (generalized)  Pain in right finger(s)  Pain in right wrist  Stiffness of right wrist, not elsewhere classified    Problem List There are no problems to display for this patient.   Oletta Cohn, OTR/L,CLT 08/15/2022,  1:10 PM  [facility] [address], [place], [address] Phone: [phone]   Fax:  [phone]  Name: Austin Blankenship MRN: [id number] Date of Birth: Jun 23, 1985

## 2022-08-20 ENCOUNTER — Ambulatory Visit: Payer: 59 | Attending: Student | Admitting: Occupational Therapy

## 2022-08-20 DIAGNOSIS — M25641 Stiffness of right hand, not elsewhere classified: Secondary | ICD-10-CM | POA: Diagnosis present

## 2022-08-20 DIAGNOSIS — L905 Scar conditions and fibrosis of skin: Secondary | ICD-10-CM | POA: Diagnosis present

## 2022-08-20 DIAGNOSIS — M25631 Stiffness of right wrist, not elsewhere classified: Secondary | ICD-10-CM | POA: Insufficient documentation

## 2022-08-20 DIAGNOSIS — M79644 Pain in right finger(s): Secondary | ICD-10-CM | POA: Insufficient documentation

## 2022-08-20 DIAGNOSIS — R600 Localized edema: Secondary | ICD-10-CM | POA: Diagnosis not present

## 2022-08-20 DIAGNOSIS — M6281 Muscle weakness (generalized): Secondary | ICD-10-CM | POA: Insufficient documentation

## 2022-08-20 DIAGNOSIS — M25531 Pain in right wrist: Secondary | ICD-10-CM | POA: Diagnosis present

## 2022-08-20 NOTE — Therapy (Signed)
[facility] [address], [place], [address] Phone: [phone]   Fax:  [phone]  Occupational Therapy Treatment  Patient Details  Name: Austin Blankenship MRN: [id number] Date of Birth: [birth year] Referring Provider (OT): [place] PA   Encounter Date: 08/20/2022   OT End of Session - 08/20/22 1345     Visit Number 7    Number of Visits 24    Date for OT Re-Evaluation 10/14/22    OT Start Time 1323    OT Stop Time 1410    OT Time Calculation (min) 47 min    Activity Tolerance Patient tolerated treatment well;Patient limited by pain    Behavior During Therapy [facility] for tasks assessed/performed             Past Medical History:  Diagnosis Date   Medical history non-contributory     Past Surgical History:  Procedure Laterality Date   CARPAL TUNNEL RELEASE Right 06/28/2022   Procedure: CARPAL TUNNEL RELEASE;  Surgeon: [name], MD;  Location: [facility];  Service: Orthopedics;  Laterality: Right;   FRACTURE SURGERY     left leg.  age 37 or 49   OPEN REDUCTION INTERNAL FIXATION (ORIF) DISTAL RADIAL FRACTURE Right 06/28/2022   Procedure: OPEN REDUCTION INTERNAL FIXATION (ORIF) DISTAL RADIUS FRACTURE;  Surgeon: [name], MD;  Location: [facility];  Service: Orthopedics;  Laterality: Right;   TONSILLECTOMY     and Adenoids as child    There were no vitals filed for this visit.   Subjective Assessment - 08/20/22 1344     Subjective  I done the hot and cold like you told me - and look my skin is softer and my passive motion is better- wearing the soft wrist wrap mostly - not hard one anymore    Pertinent History 07/12/22 ORTHo NOTE- Austin Blankenship is a 37 y.o. male who presents today for suture removal status post a open reduction internal fixation of right distal radius fracture in addition to a carpal tunnel release. The patient suffered a right distal radius fracture and underwent  surgery with Dr. Rosita Kea on 06/28/2022. The patient had a complex distal radius fracture which did require use of both the volar and dorsal plate. The patient also suffered acute carpal tunnel at the time the injury and did have to undergo a carpal tunnel release. The patient was evaluated for skin check and was placed in a Velcro wrist splint, he was instructed to begin using his hand and flexing and extending his fingers. The patient presents today with still moderate swelling throughout the right hand, he does still report a burning in the median nerve distribution but does feel that the sensation is improving. He has been working on trying to make a full fist. He has been nonweightbearing to the right upper extremity. The patient is taking hydrocodone as needed for discomfort. Pain score today is a 7 out of 10. 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 No/denies                [facility] OT Assessment - 08/20/22 0001       AROM   Right Forearm Pronation 90 Degrees    Right Forearm Supination 65 Degrees    Right Wrist Extension  22 Degrees    Right Wrist Flexion 55 Degrees    Right Wrist Radial Deviation 20 Degrees    Right Wrist Ulnar Deviation 25 Degrees      Right Hand AROM   R Thumb MCP 0-60 60 Degrees    R Thumb IP 0-80 35 Degrees    R Thumb Radial ABduction/ADduction 0-55 45    R Thumb Palmar ABduction/ADduction 0-45 55    R Index  MCP 0-90 85 Degrees    R Index PIP 0-100 80 Degrees   -15 ext   R Long  MCP 0-90 85 Degrees    R Long PIP 0-100 90 Degrees   -20 ext   R Ring  MCP 0-90 90 Degrees    R Ring PIP 0-100 100 Degrees   -20 ext   R Little  MCP 0-90 85 Degrees    R Little PIP 0-100 90 Degrees   0 ext                     OT Treatments/Exercises (OP) - 08/20/22 0001       RUE Fluidotherapy   Number Minutes  Fluidotherapy 8 Minutes    RUE Fluidotherapy Location Hand;Wrist    Comments AROM increase motion and decrease edema            Patient to do 2-3 times a day contrast using heating pads and cold pack Can use LMB splint for PIP extension on 3rd and 4th during contrast  as well as supination and intrinsic digits stretch using heat  reviewed with patient wearing and precautions   Soft tissue massage for swelling as well as scar massage This date done kinesiotape 2 parallel at 20%  And 3 across at 100 % pull - to wear during day - ed pt and precautions   Done MC and CT spreads with thumb PA and RA AAROM done -and reviewed  Some light traction to PIP's with extention stretch   Wear cica scar pad night time     Reinforce extention of digits rolling over red roller and on table = pressure behind PIP's  Inbetween tendon glides and flexion of digits PROM to DIP and PIP flexion prior to  INTRINSIC FIST - PROM  AND THEN COMPOSITE to  high lighter in palm Add yellow foam block gripping 12 reps followed by  Extention irolling over red roller     PROM done on edge of table ulnar /radial deviation  - AROM WNL coming in  flexion, extention 12 reps over edge of table at home CPM for wrist extention 2 x 200 sec SUpination PROM done by OT  Followed by 16oz hammer with enlarge grip on pillow - 12 reps - light stretch relax into supination   Opposition - thumb IP and MC flexion PROM 12 reps  Prior to oppositoin to all digits  12 reps      OT Education - 08/20/22 1345     Education Details progress and HEP    Person(s) Educated Patient;Other (comment)    Methods Explanation;Demonstration;Tactile cues;Verbal cues;Handout    Comprehension Verbal cues required;Returned demonstration;Verbalized understanding              OT Short Term Goals - 07/22/22 1330       OT SHORT TERM GOAL #1   Title Patient to be independent in home program to decrease edema and increase motion for pain and  to be able to lay fingers and wrist straight on table.  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  Flexor contracture shoulder with pain with attempts of passive range of motion at PIP [name]  Unable to do palmar ABD- thumb resting more in palmar-coming in patient keep wrist in flexion even in brace.    Time 2    Period Weeks    Status New    Target Date 08/05/22      OT SHORT TERM GOAL #2   Title Patient demo understanding to wear wrist correctly and position right arm during the day correct to decrease flexion contractures in digits and  wrist    Baseline Patient wrist hand and wrist splint in flexion; fingers in flexion contractures at the MCP and PIPs-unable to straighten PIPs with passive and active range of motion    Time 3    Period Weeks    Status New    Target Date 08/12/22               OT Long Term Goals - 07/22/22 1333       OT LONG TERM GOAL #1   Title Digit flexion increased for patient to touch palm without increased symptoms to initiate using it in ADLs    Baseline MC flexion 55 to 60 degrees, PIP's 45 to 60 degrees -unable to use hand in any bathing or dressing or eating.    Time 4    Period Weeks    Status New    Target Date 08/19/22      OT LONG TERM GOAL #2   Title Right wrist active range of motion increased to within functional limits for patient to use right hand in 50% of ADLs    Baseline Right wrist extension 5 degrees and flexion 30 degrees, ulnar deviation 5 degrees and radial deviation 12-not using the right hand or wrist activities    Time 6    Period Weeks    Status New    Target Date 09/02/22      OT LONG TERM GOAL #3   Title Right forearm supination pronation increased to within functional limits for patient to turn doorknob with minimal symptoms    Baseline Patient decreased greatly in supination 50 and pronation 70    Time 6    Period Weeks    Status New    Target Date 09/02/22      OT LONG TERM GOAL #4   Title Right  digit extension as well as thumb palmar radial abduction within functional limits to put hand in pocket and don gloves with no issues    Baseline Palmar abduction 48 and radial abduction 40 degrees extension limited -10 to -15 at MC's, PIPs -30 to -50    Time 5    Period Weeks    Status New    Target Date 08/26/22      OT LONG TERM GOAL #5   Title Strength in right hand and wrist for patient to be able to carry 5 pounds with no increase symptoms, push and pull heavy door and initiate using tools    Baseline Patient 3-1/2 weeks postop.  Steri-Strips removed today-severe edema and stiffness in right hand and wrist.    Time 12    Period Weeks    Status New    Target Date 10/14/22                   Plan - 08/20/22 1445     Clinical Impression Statement Patient presented OT evaluation with diagnosis of  s/p  right distal radius fracture with volar and dorsal plate, and carpal tunnel release by Dr. Rosita Kea 06/28/2022.  Pt admitted last week he did not done contrast at home - this date pt arrive with edema better, skin softer and PROM and AROM increase.  Pt to cont focus on scar mobs. Pt to focus on PROM wrist extention - not AROM without stretches to prevent extensor tendinitis.  Wrist flexion and supination improving - RD, UD WNL. PROM for digits able to touch 3rd thru 5th palm but unable to do actively-Also improving in digits extention.  Pt to focus on PROM thumb IP and digits  PIP/DIP flexion prior to composite flexion. Pt to keep pain under 2/10.   Patient  sensation improving in thumb thru 4th.   Patient limited severely in functional use of right hand and wrist in ADLs and IADLs.  Patient can benefit from skilled OT services to decrease edema and pain and increase motion and strength to return to prior level of function.    OT Occupational Profile and History Problem Focused Assessment - Including review of records relating to presenting problem    Occupational performance deficits (Please  refer to evaluation for details): ADL's;IADL's;Work;Play;Social Participation;Leisure    Body Structure / Function / Physical Skills ADL;Decreased knowledge of precautions;Flexibility;ROM;UE functional use;Scar mobility;FMC;Dexterity;Sensation;Strength;Pain;IADL;Edema;Coordination    Rehab Potential Fair    Clinical Decision Making Limited treatment options, no task modification necessary    Comorbidities Affecting Occupational Performance: None    Modification or Assistance to Complete Evaluation  No modification of tasks or assist necessary to complete eval    OT Frequency 2x / week    OT Duration 12 weeks    OT Treatment/Interventions Self-care/ADL training;Therapeutic exercise;Patient/family education;Splinting;Paraffin;Fluidtherapy;Contrast Bath;DME and/or AE instruction;Manual Therapy;Passive range of motion;Scar mobilization;Therapeutic activities    Consulted and Agree with Plan of Care Patient             Patient will benefit from skilled therapeutic intervention in order to improve the following deficits and impairments:   Body Structure / Function / Physical Skills: ADL, Decreased knowledge of precautions, Flexibility, ROM, UE functional use, Scar mobility, FMC, Dexterity, Sensation, Strength, Pain, IADL, Edema, Coordination       Visit Diagnosis: Localized edema  Stiffness of right hand, not elsewhere classified  Muscle weakness (generalized)  Stiffness of right wrist, not elsewhere classified  Pain in right finger(s)  Scar condition and fibrosis of skin    Problem List There are no problems to display for this patient.   Oletta Cohn, OTR/L,CLT 08/20/2022, 2:50 PM  [facility] [address], [place], [address] Phone: [phone]   Fax:  [phone]  Name: Austin Blankenship MRN: [id number] Date of Birth: 22-Mar-1985

## 2022-08-23 ENCOUNTER — Ambulatory Visit: Payer: 59 | Admitting: Occupational Therapy

## 2022-08-26 ENCOUNTER — Ambulatory Visit: Payer: 59 | Admitting: Occupational Therapy

## 2022-08-26 DIAGNOSIS — R600 Localized edema: Secondary | ICD-10-CM

## 2022-08-26 DIAGNOSIS — M6281 Muscle weakness (generalized): Secondary | ICD-10-CM

## 2022-08-26 DIAGNOSIS — M25641 Stiffness of right hand, not elsewhere classified: Secondary | ICD-10-CM

## 2022-08-26 DIAGNOSIS — L905 Scar conditions and fibrosis of skin: Secondary | ICD-10-CM

## 2022-08-26 DIAGNOSIS — M25631 Stiffness of right wrist, not elsewhere classified: Secondary | ICD-10-CM

## 2022-08-26 DIAGNOSIS — M25531 Pain in right wrist: Secondary | ICD-10-CM

## 2022-08-26 DIAGNOSIS — M79644 Pain in right finger(s): Secondary | ICD-10-CM

## 2022-08-26 NOTE — Therapy (Signed)
[facility] [address] [place], [address] Phone: [phone]   Fax:  [phone]  Occupational Therapy Treatment  Patient Details  Name: Austin Blankenship MRN: [id number] Date of Birth: 07-12-1985 Referring Provider (OT): [place] PA   Encounter Date: 08/26/2022   OT End of Session - 08/26/22 1704     Visit Number 8    Number of Visits 24    Date for OT Re-Evaluation 10/14/22    OT Start Time 1612    OT Stop Time 1700    OT Time Calculation (min) 48 min    Activity Tolerance Patient tolerated treatment well    Behavior During Therapy [facility] for tasks assessed/performed             Past Medical History:  Diagnosis Date   Medical history non-contributory     Past Surgical History:  Procedure Laterality Date   CARPAL TUNNEL RELEASE Right 06/28/2022   Procedure: CARPAL TUNNEL RELEASE;  Surgeon: [name], MD;  Location: [facility];  Service: Orthopedics;  Laterality: Right;   FRACTURE SURGERY     left leg.  age 26 or 9   OPEN REDUCTION INTERNAL FIXATION (ORIF) DISTAL RADIAL FRACTURE Right 06/28/2022   Procedure: OPEN REDUCTION INTERNAL FIXATION (ORIF) DISTAL RADIUS FRACTURE;  Surgeon: [name], MD;  Location: [facility];  Service: Orthopedics;  Laterality: Right;   TONSILLECTOMY     and Adenoids as child    There were no vitals filed for this visit.   Subjective Assessment - 08/26/22 1613     Subjective  I am doing my exercises - did not know if my hand and wrist will get better- but it is getting there- not using it - just very light things    Pertinent History 07/12/22 ORTHo NOTE- Austin Blankenship is a 37 y.o. male who presents today for suture removal status post a open reduction internal fixation of right distal radius fracture in addition to a carpal tunnel release. The patient suffered a right distal radius fracture and underwent surgery with Dr. Rosita Kea on 06/28/2022. The patient had  a complex distal radius fracture which did require use of both the volar and dorsal plate. The patient also suffered acute carpal tunnel at the time the injury and did have to undergo a carpal tunnel release. The patient was evaluated for skin check and was placed in a Velcro wrist splint, he was instructed to begin using his hand and flexing and extending his fingers. The patient presents today with still moderate swelling throughout the right hand, he does still report a burning in the median nerve distribution but does feel that the sensation is improving. He has been working on trying to make a full fist. He has been nonweightbearing to the right upper extremity. The patient is taking hydrocodone as needed for discomfort. Pain score today is a 7 out of 10. 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 Yes    Pain Score 2     Pain Location Hand    Pain Orientation Right    Pain Descriptors / Indicators Tightness;Aching    Pain Type Surgical pain    Pain Onset More than a month ago    Pain Frequency Intermittent  [facility] OT Assessment - 08/26/22 0001       AROM   Right Forearm Pronation 90 Degrees    Right Forearm Supination 70 Degrees    Right Wrist Extension 30 Degrees    Right Wrist Flexion 55 Degrees    Right Wrist Radial Deviation 20 Degrees    Right Wrist Ulnar Deviation 25 Degrees      Strength   Right Hand Grip (lbs) 10    Right Hand Lateral Pinch 8 lbs    Right Hand 3 Point Pinch 6 lbs    Left Hand Grip (lbs) 85    Left Hand Lateral Pinch 26 lbs    Left Hand 3 Point Pinch 23 lbs               Cont to make progress in wrist AROM  And grip and prehension strength- see flowsheet       OT Treatments/Exercises (OP) - 08/26/22 0001       RUE Fluidotherapy   Number Minutes Fluidotherapy 8 Minutes     RUE Fluidotherapy Location Hand;Wrist    Comments decrease stiffness             Patient to do 2-3 times a day contrast using heating pads and cold pack Can use LMB splint for PIP extension on 3rd and 4th during contrast  as well as supination and intrinsic digits stretch using heat  reviewed with patient wearing and precautions   Soft tissue massage for swelling as well as scar massage Done this date xtractor at distal wrist scar and CT scar with digits AROM  And mini massager - tender and burning pain  This date done kinesiotape 100 pull over distal wrist and CT scar - X  Pt to do during day at home    Done [facility] and CT spreads with thumb PA and RA AAROM done -and reviewed  Some light traction to PIP's with extention stretch    Wear cica scar pad night time     Reinforce extention of digits rolling over red roller and on table = pressure behind PIP's  Inbetween tendon glides and flexion of digits PROM to DIP and PIP flexion prior to  INTRINSIC FIST - PROM  AND THEN COMPOSITE to  high lighter in palm  yellow foam block gripping 12 reps  But this date med putty for gripping , lat and 3 point pinch- 3 sec pain free into putty  12 reps 2 x day  Extention rolling over red roller     PROM done on edge of table ulnar /radial deviation   flexion, extention 12 reps over edge of table at home and in clinic  Prior to add and review prayer stretch and table slides 20 reps painfree   CPM for wrist extention 1 x 200 sec- increase to 40 degrees  SUpination PROM done by OT  Followed by 16oz hammer with enlarge grip thigh - 12 reps - light stretch relax into supination min A    Opposition - thumb IP and MC flexion PROM 12 reps  Prior to oppositoin to all digits  12 reps           OT Education - 08/26/22 1704     Education Details progress and HEP    Person(s) Educated Patient    Methods Explanation;Demonstration;Tactile cues;Verbal cues;Handout    Comprehension Verbal cues  required;Returned demonstration;Verbalized understanding              OT Short Term Goals -  07/22/22 1330       OT SHORT TERM GOAL #1   Title Patient to be independent in home program to decrease edema and increase motion for pain and to be able to lay fingers and wrist straight on table.    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  Flexor contracture shoulder with pain with attempts of passive range of motion at PIP [name]  Unable to do palmar ABD- thumb resting more in palmar-coming in patient keep wrist in flexion even in brace.    Time 2    Period Weeks    Status New    Target Date 08/05/22      OT SHORT TERM GOAL #2   Title Patient demo understanding to wear wrist correctly and position right arm during the day correct to decrease flexion contractures in digits and  wrist    Baseline Patient wrist hand and wrist splint in flexion; fingers in flexion contractures at the MCP and PIPs-unable to straighten PIPs with passive and active range of motion    Time 3    Period Weeks    Status New    Target Date 08/12/22               OT Long Term Goals - 07/22/22 1333       OT LONG TERM GOAL #1   Title Digit flexion increased for patient to touch palm without increased symptoms to initiate using it in ADLs    Baseline MC flexion 55 to 60 degrees, PIP's 45 to 60 degrees -unable to use hand in any bathing or dressing or eating.    Time 4    Period Weeks    Status New    Target Date 08/19/22      OT LONG TERM GOAL #2   Title Right wrist active range of motion increased to within functional limits for patient to use right hand in 50% of ADLs    Baseline Right wrist extension 5 degrees and flexion 30 degrees, ulnar deviation 5 degrees and radial deviation 12-not using the right hand or wrist activities    Time 6    Period Weeks    Status New    Target Date 09/02/22      OT LONG TERM GOAL #3   Title Right forearm supination pronation increased to within  functional limits for patient to turn doorknob with minimal symptoms    Baseline Patient decreased greatly in supination 50 and pronation 70    Time 6    Period Weeks    Status New    Target Date 09/02/22      OT LONG TERM GOAL #4   Title Right digit extension as well as thumb palmar radial abduction within functional limits to put hand in pocket and don gloves with no issues    Baseline Palmar abduction 48 and radial abduction 40 degrees extension limited -10 to -15 at MC's, PIPs -30 to -50    Time 5    Period Weeks    Status New    Target Date 08/26/22      OT LONG TERM GOAL #5   Title Strength in right hand and wrist for patient to be able to carry 5 pounds with no increase symptoms, push and pull heavy door and initiate using tools    Baseline Patient 3-1/2 weeks postop.  Steri-Strips removed today-severe edema and stiffness in right hand and wrist.    Time 12    Period Weeks  Status New    Target Date 10/14/22                   Plan - 08/26/22 1705     Clinical Impression Statement Patient presented OT evaluation with diagnosis of s/p  right distal radius fracture with volar and dorsal plate, and carpal tunnel release by Dr. Rosita Kea 06/28/2022.  Pt admitted 2 wks ago that he did not done contrast at home - since then pt is making progress every session-  edema better, skin softer and PROM and AROM increase in digits.  Pt to cont focus on scar mobs. Pt to focus on PROM wrist extention, flexion and supination.  PROM for digits able to touch 3rd thru 5th palm but unable to do actively-Also improving in digits extention.  Pt to focus on PROM thumb IP and digits  PIP/DIP flexion prior to composite flexion. Pt to keep pain under 2/10.  Provided pt med putty for gripping and prehension - as well as review and show pt to use R hand in eating , and carry and pick up objects 3 lbs or less.  Patient  sensation improving in thumb thru 4th.   Patient limited severely in functional use of  right hand and wrist in ADLs and IADLs.  Patient can benefit from skilled OT services to decrease edema and pain and increase motion and strength to return to prior level of function.    OT Occupational Profile and History Problem Focused Assessment - Including review of records relating to presenting problem    Occupational performance deficits (Please refer to evaluation for details): ADL's;IADL's;Work;Play;Social Participation;Leisure    Body Structure / Function / Physical Skills ADL;Decreased knowledge of precautions;Flexibility;ROM;UE functional use;Scar mobility;FMC;Dexterity;Sensation;Strength;Pain;IADL;Edema;Coordination    Rehab Potential Fair    Clinical Decision Making Limited treatment options, no task modification necessary    Comorbidities Affecting Occupational Performance: None    Modification or Assistance to Complete Evaluation  No modification of tasks or assist necessary to complete eval    OT Frequency 2x / week    OT Duration 12 weeks    OT Treatment/Interventions Self-care/ADL training;Therapeutic exercise;Patient/family education;Splinting;Paraffin;Fluidtherapy;Contrast Bath;DME and/or AE instruction;Manual Therapy;Passive range of motion;Scar mobilization;Therapeutic activities    Consulted and Agree with Plan of Care Patient             Patient will benefit from skilled therapeutic intervention in order to improve the following deficits and impairments:   Body Structure / Function / Physical Skills: ADL, Decreased knowledge of precautions, Flexibility, ROM, UE functional use, Scar mobility, FMC, Dexterity, Sensation, Strength, Pain, IADL, Edema, Coordination       Visit Diagnosis: Localized edema  Stiffness of right hand, not elsewhere classified  Muscle weakness (generalized)  Stiffness of right wrist, not elsewhere classified  Pain in right finger(s)  Pain in right wrist  Scar condition and fibrosis of skin    Problem List There are no problems  to display for this patient.   Oletta Cohn, OTR/L,CLT 08/26/2022, 5:08 PM  [facility] [address] [place], [address] Phone: [phone]   Fax:  [phone]  Name: Austin Blankenship MRN: [id number] Date of Birth: May 07, 1985

## 2022-08-30 ENCOUNTER — Ambulatory Visit: Payer: 59 | Admitting: Occupational Therapy

## 2022-08-30 DIAGNOSIS — M25531 Pain in right wrist: Secondary | ICD-10-CM

## 2022-08-30 DIAGNOSIS — M25641 Stiffness of right hand, not elsewhere classified: Secondary | ICD-10-CM

## 2022-08-30 DIAGNOSIS — M25631 Stiffness of right wrist, not elsewhere classified: Secondary | ICD-10-CM

## 2022-08-30 DIAGNOSIS — L905 Scar conditions and fibrosis of skin: Secondary | ICD-10-CM

## 2022-08-30 DIAGNOSIS — M79644 Pain in right finger(s): Secondary | ICD-10-CM

## 2022-08-30 DIAGNOSIS — M6281 Muscle weakness (generalized): Secondary | ICD-10-CM

## 2022-08-30 DIAGNOSIS — R600 Localized edema: Secondary | ICD-10-CM | POA: Diagnosis not present

## 2022-08-30 NOTE — Therapy (Signed)
[facility] [address] [place], [address] Phone: [phone]   Fax:  [phone]  Occupational Therapy Treatment  Patient Details  Name: Austin Blankenship MRN: [id number] Date of Birth: 08-23-1985 Referring Provider (OT): [place] PA   Encounter Date: 08/30/2022   OT End of Session - 08/30/22 1514     Visit Number 9    Number of Visits 24    OT Start Time 0945    OT Stop Time 1031    OT Time Calculation (min) 46 min    Activity Tolerance Patient tolerated treatment well    Behavior During Therapy [facility] for tasks assessed/performed             Past Medical History:  Diagnosis Date   Medical history non-contributory     Past Surgical History:  Procedure Laterality Date   CARPAL TUNNEL RELEASE Right 06/28/2022   Procedure: CARPAL TUNNEL RELEASE;  Surgeon: [name], MD;  Location: [facility];  Service: Orthopedics;  Laterality: Right;   FRACTURE SURGERY     left leg.  age 37 or 37   OPEN REDUCTION INTERNAL FIXATION (ORIF) DISTAL RADIAL FRACTURE Right 06/28/2022   Procedure: OPEN REDUCTION INTERNAL FIXATION (ORIF) DISTAL RADIUS FRACTURE;  Surgeon: [name], MD;  Location: [facility];  Service: Orthopedics;  Laterality: Right;   TONSILLECTOMY     and Adenoids as child    There were no vitals filed for this visit.   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 Yes    Pain Score 2     Pain Location Wrist    Pain Orientation Right    Pain Descriptors / Indicators Tightness;Tender    Pain Type Surgical pain    Pain Onset More than a month ago    Pain  Frequency Intermittent                [facility] OT Assessment - 08/30/22 0001       AROM   Right Forearm Supination 75 Degrees               Pt came in for am appt- wrist ext and flexion less than last time  But up improving  In session flexion and extention increased       OT Treatments/Exercises (OP) - 08/30/22 0001       RUE Paraffin   Number Minutes Paraffin 8 Minutes    RUE Paraffin Location Wrist    Type Other    Comments sup stretch , wrist ext stretch 2 min with heatingpad            Patient to do 2-3 times a day contrast using heating pads and cold pack Can use LMB splint for PIP extension on 3rd and 4th during contrast  as well as supination and intrinsic digits stretch using heat     Soft tissue massage for swelling as well as scar massage Done this date xtractor 3 x  at distal wrist scar and CT scar with digits AROM  And mini  massager - tender and burning pain  This date done kinesiotape and add to HEP for them to use at home during day     Done [facility] and CT spreads with thumb PA and RA AAROM done -and reviewed  Some light traction to PIP's with extention stretch    Wear cica scar pad night time     Reinforce extention of digits rolling over red roller and on table = pressure behind PIP's  Inbetween tendon glides and flexion of digits PROM to DIP and PIP flexion prior to  INTRINSIC FIST - PROM  AND THEN COMPOSITE to  high lighter in palm    med putty for gripping , lat and 3 point pinch- 3 sec pain free into putty  12 reps 2 x day  Extention rolling over red roller     PROM over edge of table ulnar /radial deviation   flexion, extention 12 reps over edge of table at home  review prayer stretch and  add last time table slides 20 reps painfree    CPM for wrist extention 2 x 200 sec- increase to 40 degrees  Followed by 1 lbs place and hold wrist ext 12 reps SUpination PROM done by OT  Followed by  2 lbs on thigh 12 reps - light stretch  relax into supination min A  And 1 lbs or 16 oz hammer for RD/ UD over arm rest - 12 reps    Opposition - thumb IP and MC flexion PROM 12 reps  Prior to oppositoin to all digits  12 reps          OT Education - 08/30/22 1514     Education Details progress and HEP    Person(s) Educated Patient    Methods Explanation;Demonstration;Tactile cues;Verbal cues;Handout    Comprehension Verbal cues required;Returned demonstration;Verbalized understanding              OT Short Term Goals - 07/22/22 1330       OT SHORT TERM GOAL #1   Title Patient to be independent in home program to decrease edema and increase motion for pain and to be able to lay fingers and wrist straight on table.    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  Flexor contracture shoulder with pain with attempts of passive range of motion at PIP [name]  Unable to do palmar ABD- thumb resting more in palmar-coming in patient keep wrist in flexion even in brace.    Time 2    Period Weeks    Status New    Target Date 08/05/22      OT SHORT TERM GOAL #2   Title Patient demo understanding to wear wrist correctly and position right arm during the day correct to decrease flexion contractures in digits and  wrist    Baseline Patient wrist hand and wrist splint in flexion; fingers in flexion contractures at the MCP and PIPs-unable to straighten PIPs with passive and active range of motion    Time 3    Period Weeks    Status New    Target Date 08/12/22               OT Long Term Goals - 07/22/22 1333       OT LONG TERM GOAL #1   Title Digit flexion increased for patient to touch palm without increased symptoms to initiate using it in ADLs    Baseline MC flexion 55 to 60 degrees, PIP's 45 to 60 degrees -unable  to use hand in any bathing or dressing or eating.    Time 4    Period Weeks    Status New    Target Date 08/19/22      OT LONG TERM GOAL #2   Title Right wrist active range of motion  increased to within functional limits for patient to use right hand in 50% of ADLs    Baseline Right wrist extension 5 degrees and flexion 30 degrees, ulnar deviation 5 degrees and radial deviation 12-not using the right hand or wrist activities    Time 6    Period Weeks    Status New    Target Date 09/02/22      OT LONG TERM GOAL #3   Title Right forearm supination pronation increased to within functional limits for patient to turn doorknob with minimal symptoms    Baseline Patient decreased greatly in supination 50 and pronation 70    Time 6    Period Weeks    Status New    Target Date 09/02/22      OT LONG TERM GOAL #4   Title Right digit extension as well as thumb palmar radial abduction within functional limits to put hand in pocket and don gloves with no issues    Baseline Palmar abduction 48 and radial abduction 40 degrees extension limited -10 to -15 at MC's, PIPs -30 to -50    Time 5    Period Weeks    Status New    Target Date 08/26/22      OT LONG TERM GOAL #5   Title Strength in right hand and wrist for patient to be able to carry 5 pounds with no increase symptoms, push and pull heavy door and initiate using tools    Baseline Patient 3-1/2 weeks postop.  Steri-Strips removed today-severe edema and stiffness in right hand and wrist.    Time 12    Period Weeks    Status New    Target Date 10/14/22                   Plan - 08/30/22 1515     Clinical Impression Statement Patient presented OT evaluation with diagnosis of s/p  right distal radius fracture with volar and dorsal plate, and carpal tunnel release by Dr. Rosita Kea 06/28/2022.  PT about 8 wks s/p - pt started focusing on contrast at thome about 2 wks ago  - since then pt is making progress every session-  edema better, skin softer and PROM and AROM increase in digits and wrist.  Pt to cont focus on scar mobs- tender and adhere limiting his motion. Pt to focus on PROM wrist extention, flexion and supination.   PROM for digits able to touch 3rd thru 5th palm but unable to do actively-Also improving in digits extention.  Pt to focus on PROM thumb IP and digits  PIP/DIP flexion prior to composite flexion. Pt to keep pain under 2/10. Pt cont with med putty for gripping and prehension - since last time pt using R hand more functionally in ADL's.  Patient  sensation improving in thumb thru 4th.   Patient limited severely in functional use of right hand and wrist in ADLs and IADLs. but improving.   Patient can benefit from skilled OT services to decrease scar tissue, and pain and increase motion and strength to return to prior level of function.    OT Occupational Profile and History Problem Focused Assessment - Including review of records relating  to presenting problem    Occupational performance deficits (Please refer to evaluation for details): ADL's;IADL's;Work;Play;Social Participation;Leisure    Body Structure / Function / Physical Skills ADL;Decreased knowledge of precautions;Flexibility;ROM;UE functional use;Scar mobility;FMC;Dexterity;Sensation;Strength;Pain;IADL;Edema;Coordination    Rehab Potential Fair    Clinical Decision Making Limited treatment options, no task modification necessary    Comorbidities Affecting Occupational Performance: None    Modification or Assistance to Complete Evaluation  No modification of tasks or assist necessary to complete eval    OT Frequency 2x / week    OT Duration 12 weeks    OT Treatment/Interventions Self-care/ADL training;Therapeutic exercise;Patient/family education;Splinting;Paraffin;Fluidtherapy;Contrast Bath;DME and/or AE instruction;Manual Therapy;Passive range of motion;Scar mobilization;Therapeutic activities    Consulted and Agree with Plan of Care Patient             Patient will benefit from skilled therapeutic intervention in order to improve the following deficits and impairments:   Body Structure / Function / Physical Skills: ADL, Decreased  knowledge of precautions, Flexibility, ROM, UE functional use, Scar mobility, FMC, Dexterity, Sensation, Strength, Pain, IADL, Edema, Coordination       Visit Diagnosis: Muscle weakness (generalized)  Stiffness of right hand, not elsewhere classified  Pain in right wrist  Scar condition and fibrosis of skin  Stiffness of right wrist, not elsewhere classified  Pain in right finger(s)    Problem List There are no problems to display for this patient.   Oletta Cohn, OTR/L,CLT 08/30/2022, 3:20 PM  [facility] [address], [place], [address] Phone: [phone]   Fax:  [phone]  Name: Austin Blankenship MRN: [id number] Date of Birth: 03-01-1985

## 2022-09-02 ENCOUNTER — Ambulatory Visit: Payer: 59 | Admitting: Occupational Therapy

## 2022-09-02 DIAGNOSIS — M25631 Stiffness of right wrist, not elsewhere classified: Secondary | ICD-10-CM

## 2022-09-02 DIAGNOSIS — M25531 Pain in right wrist: Secondary | ICD-10-CM

## 2022-09-02 DIAGNOSIS — R600 Localized edema: Secondary | ICD-10-CM

## 2022-09-02 DIAGNOSIS — M79644 Pain in right finger(s): Secondary | ICD-10-CM

## 2022-09-02 DIAGNOSIS — M6281 Muscle weakness (generalized): Secondary | ICD-10-CM

## 2022-09-02 DIAGNOSIS — M25641 Stiffness of right hand, not elsewhere classified: Secondary | ICD-10-CM

## 2022-09-02 DIAGNOSIS — L905 Scar conditions and fibrosis of skin: Secondary | ICD-10-CM

## 2022-09-05 ENCOUNTER — Ambulatory Visit: Payer: 59 | Admitting: Occupational Therapy

## 2022-09-05 ENCOUNTER — Encounter: Payer: Self-pay | Admitting: Occupational Therapy

## 2022-09-05 DIAGNOSIS — R600 Localized edema: Secondary | ICD-10-CM

## 2022-09-05 DIAGNOSIS — M79644 Pain in right finger(s): Secondary | ICD-10-CM

## 2022-09-05 DIAGNOSIS — L905 Scar conditions and fibrosis of skin: Secondary | ICD-10-CM

## 2022-09-05 DIAGNOSIS — M6281 Muscle weakness (generalized): Secondary | ICD-10-CM

## 2022-09-05 DIAGNOSIS — M25641 Stiffness of right hand, not elsewhere classified: Secondary | ICD-10-CM

## 2022-09-05 DIAGNOSIS — M25631 Stiffness of right wrist, not elsewhere classified: Secondary | ICD-10-CM

## 2022-09-05 DIAGNOSIS — M25531 Pain in right wrist: Secondary | ICD-10-CM

## 2022-09-05 NOTE — Therapy (Signed)
[facility] [address] [place], [address] Phone: [phone]   Fax:  [phone]  Occupational Therapy Treatment/Progress Update Reporting period from 07/22/2022 to 09/02/2022  Patient Details  Name: Austin Blankenship MRN: [id number] Date of Birth: Dec 06, 1985 Referring Provider (OT): [place] PA   Encounter Date: 09/02/2022   OT End of Session - 09/05/22 1040     Visit Number 10    Number of Visits 24    Date for OT Re-Evaluation 10/14/22    OT Start Time 1200    OT Stop Time 1258    OT Time Calculation (min) 58 min    Activity Tolerance Patient tolerated treatment well    Behavior During Therapy [facility] for tasks assessed/performed             Past Medical History:  Diagnosis Date   Medical history non-contributory     Past Surgical History:  Procedure Laterality Date   CARPAL TUNNEL RELEASE Right 06/28/2022   Procedure: CARPAL TUNNEL RELEASE;  Surgeon: [name], MD;  Location: [facility];  Service: Orthopedics;  Laterality: Right;   FRACTURE SURGERY     left leg.  age 89 or 17   OPEN REDUCTION INTERNAL FIXATION (ORIF) DISTAL RADIAL FRACTURE Right 06/28/2022   Procedure: OPEN REDUCTION INTERNAL FIXATION (ORIF) DISTAL RADIUS FRACTURE;  Surgeon: [name], MD;  Location: [facility];  Service: Orthopedics;  Laterality: Right;   TONSILLECTOMY     and Adenoids as child    There were no vitals filed for this visit.   Subjective Assessment - 09/04/22 1039     Subjective  Pt reports he had some rightness and pain yesterday, 4/10.  Today he reports 2/10.  Has been doing ROM and hammer, 2# weight and now using to eat, wash dishes and bathe self.    Pertinent History 07/12/22 ORTHo NOTE- Austin Blankenship is a 37 y.o. male who presents today for suture removal status post a open reduction internal fixation of right distal radius fracture in addition to a carpal tunnel release. The patient  suffered a right distal radius fracture and underwent surgery with Dr. Rosita Kea on 06/28/2022. The patient had a complex distal radius fracture which did require use of both the volar and dorsal plate. The patient also suffered acute carpal tunnel at the time the injury and did have to undergo a carpal tunnel release. The patient was evaluated for skin check and was placed in a Velcro wrist splint, he was instructed to begin using his hand and flexing and extending his fingers. The patient presents today with still moderate swelling throughout the right hand, he does still report a burning in the median nerve distribution but does feel that the sensation is improving. He has been working on trying to make a full fist. He has been nonweightbearing to the right upper extremity. The patient is taking hydrocodone as needed for discomfort. Pain score today is a 7 out of 10. 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 Yes    Pain Score 2     Pain Location Wrist    Pain Orientation Right    Pain Descriptors / Indicators Tightness;Tender    Pain Type Surgical pain    Pain Onset More than a month ago  Pain Frequency Intermittent            Paraffin to right wrist and hand for 8 mins to increase tissue mobility, increase ROM and decrease pain.    Manual Therapy:     Pt seen for soft tissue massage for edema as well as scar massage Use of xtractor at distal wrist scar and CT scar with digits AROM  Use of mini massager to decrease scar tissue and promote healing/greater ROM [address] to forearm to decrease adhesions and promote ROM  MC and CT spreads with thumb PA and RA AAROM performed  Some light traction to PIP's with extension stretch   Therapeutic Exercise:  Extension of digits rolling over red roller and on table = pressure behind PIP's   tendon glides and flexion of digits PROM to DIP and PIP flexion prior to  Intrinsic fist - PROM  And then composite to item in palm PROM over edge of table ulnar /radial deviation   flexion, extension 12 reps over edge of table  prayer stretch, table slides 20 reps painfree    CPM for wrist extension 2 x 200 sec- increase to 42 degrees  Followed by 1 lbs place and hold wrist ext 12 reps Supination PROM done by OT  2 lbs on thigh 12 reps - light stretch relax into supination min A  And 1 lbs or 16 oz hammer for RD/ UD over arm rest - 12 reps    Opposition - thumb IP and MC flexion PROM 12 reps  Prior to oppositoin to all digits   HEP Patient to do 2-3 times a day contrast using heating pads and cold pack Can use LMB splint for PIP extension on 3rd and 4th during contrast  as well as supination and intrinsic digits stretch using heat  Wear cica scar pad night time  med putty for gripping , lat and 3 point pinch- 3 sec pain free into putty  12 reps 2 x day  Extension rolling over red foam cylinder      OT Education - 09/05/22 1040     Education Details progress and HEP    Person(s) Educated Patient    Methods Explanation;Demonstration;Tactile cues;Verbal cues;Handout    Comprehension Verbal cues required;Returned demonstration;Verbalized understanding              OT Short Term Goals - 09/04/22 1045       OT SHORT TERM GOAL #1   Title Patient to be independent in home program to decrease edema and increase motion for pain and to be able to lay fingers and wrist straight on table.    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  Flexor contracture shoulder with pain with attempts of passive range of motion at PIP [name]  Unable to do palmar ABD- thumb resting more in palmar-coming in patient keep wrist in flexion even in brace.    Time 2    Period Weeks    Status On-going    Target Date 08/05/22      OT SHORT TERM GOAL #2   Title Patient demo  understanding to wear wrist correctly and position right arm during the day correct to decrease flexion contractures in digits and  wrist    Baseline Patient wrist hand and wrist splint in flexion; fingers in flexion contractures at the MCP and PIPs-unable to straighten PIPs with passive and active range of motion    Time 3    Period Weeks  Status Achieved    Target Date 08/12/22               OT Long Term Goals - 09/04/22 1045       OT LONG TERM GOAL #1   Title Digit flexion increased for patient to touch palm without increased symptoms to initiate using it in ADLs    Baseline MC flexion 55 to 60 degrees, PIP's 45 to 60 degrees -unable to use hand in any bathing or dressing or eating.  09/02/22:  Pt now able to use hand with bathing, dressing and grooming and still working on ROM    Time 4    Period Weeks    Status On-going    Target Date 10/14/22      OT LONG TERM GOAL #2   Title Right wrist active range of motion increased to within functional limits for patient to use right hand in 50% of ADLs    Baseline Right wrist extension 5 degrees and flexion 30 degrees, ulnar deviation 5 degrees and radial deviation 12-not using the right hand or wrist activities7/15/24:  Pt using right hand and wrist in activities but still working on increasing active ROM    Time 6    Period Weeks    Status Partially Met    Target Date 10/14/22      OT LONG TERM GOAL #3   Title Right forearm supination pronation increased to within functional limits for patient to turn doorknob with minimal symptoms    Baseline Patient decreased greatly in supination 50 and pronation 70, 09/02/22:  continued improvement in sup/pro but still working on end ranges, sup 75, pro 90 degrees    Time 6    Period Weeks    Status On-going    Target Date 10/14/22      OT LONG TERM GOAL #4   Title Right digit extension as well as thumb palmar radial abduction within functional limits to put hand in pocket and don gloves  with no issues    Baseline Palmar abduction 48 and radial abduction 40 degrees extension limited -10 to -15 at [facility], PIPs -30 to -50 09/02/22:  Pt continues to work towards improving ROM of digits.    Time 5    Period Weeks    Status On-going    Target Date 10/14/22      OT LONG TERM GOAL #5   Title Strength in right hand and wrist for patient to be able to carry 5 pounds with no increase symptoms, push and pull heavy door and initiate using tools    Baseline Patient 3-1/2 weeks postop.  Steri-Strips removed today-severe edema and stiffness in right hand and wrist. 09/02/22:  increased strength in right hand, 10# grip last measured, difficulty with carrying heavier items.    Time 12    Period Weeks    Status On-going    Target Date 10/14/22                   Plan - 09/05/22 1041     Clinical Impression Statement Patient presented OT evaluation with diagnosis of s/p  right distal radius fracture with volar and dorsal plate, and carpal tunnel release by Dr. Rosita Kea 06/28/2022.  PT about 8 wks s/p - pt started focusing on contrast at thome about 2 wks ago  - since then pt is making progress every session-  edema better, skin softer and PROM and AROM increase in digits and wrist.  Pt to cont focus  on scar mobs- tender and adhere limiting his motion. Pt to focus on PROM wrist extention, flexion and supination.  PROM for digits able to touch 3rd thru 5th palm but unable to do actively-Also improving in digits extention.  Pt to focus on PROM thumb IP and digits  PIP/DIP flexion prior to composite flexion. Pt to keep pain under 2/10. Pt cont with med putty for gripping and prehension - since last time pt using R hand more functionally in ADL's.  Patient  sensation improving in thumb thru 4th.  Pt with increased hand function to now feed self,  bathe and wash dishes.  Continued difficulty with composite fisting.  Pt primarily using his left hand for the majority of his daily tasks but trying to  incorporate hand into lighter activities.  Patient limited severely in functional use of right hand and wrist in ADLs and IADLs. but improving.   Patient can benefit from skilled OT services to decrease scar tissue, and pain and increase motion and strength to return to prior level of function.    OT Occupational Profile and History Problem Focused Assessment - Including review of records relating to presenting problem    Occupational performance deficits (Please refer to evaluation for details): ADL's;IADL's;Work;Play;Social Participation;Leisure    Body Structure / Function / Physical Skills ADL;Decreased knowledge of precautions;Flexibility;ROM;UE functional use;Scar mobility;FMC;Dexterity;Sensation;Strength;Pain;IADL;Edema;Coordination    Rehab Potential Fair    Clinical Decision Making Limited treatment options, no task modification necessary    Comorbidities Affecting Occupational Performance: None    Modification or Assistance to Complete Evaluation  No modification of tasks or assist necessary to complete eval    OT Frequency 2x / week    OT Duration 12 weeks    OT Treatment/Interventions Self-care/ADL training;Therapeutic exercise;Patient/family education;Splinting;Paraffin;Fluidtherapy;Contrast Bath;DME and/or AE instruction;Manual Therapy;Passive range of motion;Scar mobilization;Therapeutic activities    Consulted and Agree with Plan of Care Patient             Patient will benefit from skilled therapeutic intervention in order to improve the following deficits and impairments:   Body Structure / Function / Physical Skills: ADL, Decreased knowledge of precautions, Flexibility, ROM, UE functional use, Scar mobility, FMC, Dexterity, Sensation, Strength, Pain, IADL, Edema, Coordination       Visit Diagnosis: Muscle weakness (generalized)  Stiffness of right hand, not elsewhere classified  Pain in right wrist  Scar condition and fibrosis of skin  Stiffness of right wrist,  not elsewhere classified  Pain in right finger(s)  Localized edema   Harper Vandervoort T Shaquil Aldana, OTR/L, CLT  Yussef Jorge, OT 09/05/2022, 11:35 AM  [facility] [address], [place], [address] Phone: [phone]   Fax:  [phone]  Name: CLEM WISENBAKER MRN: [id number] Date of Birth: 1985/04/29

## 2022-09-05 NOTE — Therapy (Signed)
[facility] [address] [place], [address] Phone: [phone]   Fax:  [phone]  Occupational Therapy Treatment  Patient Details  Name: Austin Blankenship MRN: [id number] Date of Birth: 1985/03/01 Referring Provider (OT): [place] PA   Encounter Date: 09/05/2022   OT End of Session - 09/05/22 1204     Visit Number 11    Number of Visits 24    Date for OT Re-Evaluation 10/14/22    OT Start Time 1203    OT Stop Time 1245    OT Time Calculation (min) 42 min    Activity Tolerance Patient tolerated treatment well    Behavior During Therapy [facility] for tasks assessed/performed             Past Medical History:  Diagnosis Date   Medical history non-contributory     Past Surgical History:  Procedure Laterality Date   CARPAL TUNNEL RELEASE Right 06/28/2022   Procedure: CARPAL TUNNEL RELEASE;  Surgeon: [name], MD;  Location: [facility];  Service: Orthopedics;  Laterality: Right;   FRACTURE SURGERY     left leg.  age 69 or 12   OPEN REDUCTION INTERNAL FIXATION (ORIF) DISTAL RADIAL FRACTURE Right 06/28/2022   Procedure: OPEN REDUCTION INTERNAL FIXATION (ORIF) DISTAL RADIUS FRACTURE;  Surgeon: [name], MD;  Location: [facility];  Service: Orthopedics;  Laterality: Right;   TONSILLECTOMY     and Adenoids as child    There were no vitals filed for this visit.   Subjective Assessment - 09/05/22 1204     Subjective  Doing better- trying to use it more- do you think I would be able to bend my wrist back like this again    Pertinent History 07/12/22 ORTHo NOTE- Austin Blankenship is a 37 y.o. male who presents today for suture removal status post a open reduction internal fixation of right distal radius fracture in addition to a carpal tunnel release. The patient suffered a right distal radius fracture and underwent surgery with Dr. Rosita Kea on 06/28/2022. The patient had a complex distal radius fracture  which did require use of both the volar and dorsal plate. The patient also suffered acute carpal tunnel at the time the injury and did have to undergo a carpal tunnel release. The patient was evaluated for skin check and was placed in a Velcro wrist splint, he was instructed to begin using his hand and flexing and extending his fingers. The patient presents today with still moderate swelling throughout the right hand, he does still report a burning in the median nerve distribution but does feel that the sensation is improving. He has been working on trying to make a full fist. He has been nonweightbearing to the right upper extremity. The patient is taking hydrocodone as needed for discomfort. Pain score today is a 7 out of 10. 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 No/denies                [facility] OT Assessment - 09/05/22 0001       AROM   Right Forearm Supination 75 Degrees    Right Wrist Extension 40 Degrees    Right Wrist Flexion 42 Degrees      Strength   Right Hand Grip (lbs) 16  Right Hand Lateral Pinch 15 lbs    Right Hand 3 Point Pinch 10 lbs    Left Hand Grip (lbs) 85    Left Hand Lateral Pinch 26 lbs    Left Hand 3 Point Pinch 23 lbs                      OT Treatments/Exercises (OP) - 09/05/22 0001       RUE Paraffin   Number Minutes Paraffin 8 Minutes    RUE Paraffin Location Wrist    Comments sup stretch prior to flexion, ext stretches             Patient to do 2-3 times a day contrast using heating pads and cold pack Can use LMB splint for PIP extension on 3rd  during contrast  as well as supination  stretch and intrinsic digits stretch using heat      Soft tissue massage  scar massage Done this date xtractor 3 x  at distal wrist scar and CT scar with digits AROM  And mini massager -  tender and burning pain  better Pt to cont with  kinesiotape and using coban for scar massage     Done MC and CT spreads with thumb PA and RA AAROM done -and reviewed  Some light traction to PIP's with extention stretch    Wear cica scar pad night time     Reinforce extention of digits rolling over red roller and on table = pressure behind PIP's  Inbetween tendon glides and flexion of digits PROM to DIP and PIP flexion prior to  INTRINSIC FIST - PROM  AND THEN COMPOSITE to  high lighter in palm    Upgrade to med firm putty for gripping , lat and 3 point pinch- 3 sec pain free into putty  12 reps 2 x day  Extention rolling over red roller     Change AAROM /PROM on yellow power web - 20 reps hold 5 sec for flexion, ext of wrist  Prior to using flex bar for wrist flexion, ext with Benik neoprene on wrist  15 reps pain free  2 x day      SUpination PROM done by OT  Followed by yellow flex bar- from neutral into sup - with Benik wrap on  15 reps \\2  x day    Opposition - thumb IP and MC flexion PROM 12 reps  Prior to oppositoin to all digits  12 reps           OT Education - 09/05/22 1204     Education Details progress and HEP changes    Person(s) Educated Patient    Methods Explanation;Demonstration;Tactile cues;Verbal cues;Handout    Comprehension Verbal cues required;Returned demonstration;Verbalized understanding              OT Short Term Goals - 09/04/22 1045       OT SHORT TERM GOAL #1   Title Patient to be independent in home program to decrease edema and increase motion for pain and to be able to lay fingers and wrist straight on table.    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  Flexor contracture shoulder with pain with attempts of passive range of motion at PIP [name]  Unable to do palmar ABD- thumb resting more in palmar-coming in patient keep wrist in flexion even in brace.    Time 2    Period Weeks    Status On-going    Target  Date 08/05/22      OT SHORT TERM GOAL #2   Title Patient demo understanding to wear wrist correctly and position right arm during the day correct to decrease flexion contractures in digits and  wrist    Baseline Patient wrist hand and wrist splint in flexion; fingers in flexion contractures at the MCP and PIPs-unable to straighten PIPs with passive and active range of motion    Time 3    Period Weeks    Status Achieved    Target Date 08/12/22               OT Long Term Goals - 09/04/22 1045       OT LONG TERM GOAL #1   Title Digit flexion increased for patient to touch palm without increased symptoms to initiate using it in ADLs    Baseline MC flexion 55 to 60 degrees, PIP's 45 to 60 degrees -unable to use hand in any bathing or dressing or eating.  09/02/22:  Pt now able to use hand with bathing, dressing and grooming and still working on ROM    Time 4    Period Weeks    Status On-going    Target Date 10/14/22      OT LONG TERM GOAL #2   Title Right wrist active range of motion increased to within functional limits for patient to use right hand in 50% of ADLs    Baseline Right wrist extension 5 degrees and flexion 30 degrees, ulnar deviation 5 degrees and radial deviation 12-not using the right hand or wrist activities7/15/24:  Pt using right hand and wrist in activities but still working on increasing active ROM    Time 6    Period Weeks    Status Partially Met    Target Date 10/14/22      OT LONG TERM GOAL #3   Title Right forearm supination pronation increased to within functional limits for patient to turn doorknob with minimal symptoms    Baseline Patient decreased greatly in supination 50 and pronation 70, 09/02/22:  continued improvement in sup/pro but still working on end ranges, sup 75, pro 90 degrees    Time 6    Period Weeks    Status On-going    Target Date 10/14/22      OT LONG TERM GOAL #4   Title Right digit extension as well as thumb palmar radial abduction  within functional limits to put hand in pocket and don gloves with no issues    Baseline Palmar abduction 48 and radial abduction 40 degrees extension limited -10 to -15 at [facility], PIPs -30 to -50 09/02/22:  Pt continues to work towards improving ROM of digits.    Time 5    Period Weeks    Status On-going    Target Date 10/14/22      OT LONG TERM GOAL #5   Title Strength in right hand and wrist for patient to be able to carry 5 pounds with no increase symptoms, push and pull heavy door and initiate using tools    Baseline Patient 3-1/2 weeks postop.  Steri-Strips removed today-severe edema and stiffness in right hand and wrist. 09/02/22:  increased strength in right hand, 10# grip last measured, difficulty with carrying heavier items.    Time 12    Period Weeks    Status On-going    Target Date 10/14/22                   Plan -  09/05/22 1205     Clinical Impression Statement Patient presented OT evaluation with diagnosis of s/p  right distal radius fracture with volar and dorsal plate, and carpal tunnel release by Dr. Rosita Kea 06/28/2022.  PT about 2 1/2 months s/p - pt making the last few session more progress- edema better, skin softer and PROM and AROM increase in digits and wrist.  Pt to cont focus on scar mobs -but improving - adhesions limiting his  flexion and extention of wrist. . Pt to focus on PROM wrist extention, flexion and supination.   This date change HEP for pt to use power web for stretches and flex bar for strengthening - and borrowed fot pt to use at home. Cont PROM for digits- able to touch 3rd thru 5th palm -Also improving in digits extention.  Pt to focus on PROM thumb IP and digits  PIP/DIP flexion prior to composite flexion. Pt to keep pain under 2/10. Great progress in grip and prehension strenght - upgrade to med firm green putty. Do report increase use of R hand functionally in ADL's.  Patient  sensation improving in thumb thru 4th. Abld to push and pull heavy door- and  carry 8 lbs with slight pull. Patient limited severely in functional use of right hand and wrist in ADLs and IADLs. but improving.   Patient can benefit from skilled OT services to decrease scar tissue, and pain and increase motion and strength to return to prior level of function.    OT Occupational Profile and History Problem Focused Assessment - Including review of records relating to presenting problem    Occupational performance deficits (Please refer to evaluation for details): ADL's;IADL's;Work;Play;Social Participation;Leisure    Body Structure / Function / Physical Skills ADL;Decreased knowledge of precautions;Flexibility;ROM;UE functional use;Scar mobility;FMC;Dexterity;Sensation;Strength;Pain;IADL;Edema;Coordination    Rehab Potential Fair    Clinical Decision Making Limited treatment options, no task modification necessary    Comorbidities Affecting Occupational Performance: None    Modification or Assistance to Complete Evaluation  No modification of tasks or assist necessary to complete eval    OT Frequency 2x / week    OT Duration 12 weeks    OT Treatment/Interventions Self-care/ADL training;Therapeutic exercise;Patient/family education;Splinting;Paraffin;Fluidtherapy;Contrast Bath;DME and/or AE instruction;Manual Therapy;Passive range of motion;Scar mobilization;Therapeutic activities    Consulted and Agree with Plan of Care Patient             Patient will benefit from skilled therapeutic intervention in order to improve the following deficits and impairments:   Body Structure / Function / Physical Skills: ADL, Decreased knowledge of precautions, Flexibility, ROM, UE functional use, Scar mobility, FMC, Dexterity, Sensation, Strength, Pain, IADL, Edema, Coordination       Visit Diagnosis: Muscle weakness (generalized)  Stiffness of right hand, not elsewhere classified  Pain in right wrist  Scar condition and fibrosis of skin  Stiffness of right wrist, not elsewhere  classified  Localized edema  Pain in right finger(s)    Problem List There are no problems to display for this patient.   Oletta Cohn, OTR/L,CLT 09/05/2022, 1:04 PM  [facility] [address], [place], [address] Phone: [phone]   Fax:  [phone]  Name: IMANUEL PRUIETT MRN: [id number] Date of Birth: 10-16-1985

## 2022-09-09 ENCOUNTER — Encounter: Payer: 59 | Admitting: Occupational Therapy

## 2022-09-10 ENCOUNTER — Ambulatory Visit: Payer: 59 | Admitting: Occupational Therapy

## 2022-09-10 DIAGNOSIS — L905 Scar conditions and fibrosis of skin: Secondary | ICD-10-CM

## 2022-09-10 DIAGNOSIS — R600 Localized edema: Secondary | ICD-10-CM | POA: Diagnosis not present

## 2022-09-10 DIAGNOSIS — M6281 Muscle weakness (generalized): Secondary | ICD-10-CM

## 2022-09-10 DIAGNOSIS — M25631 Stiffness of right wrist, not elsewhere classified: Secondary | ICD-10-CM

## 2022-09-10 DIAGNOSIS — M25531 Pain in right wrist: Secondary | ICD-10-CM

## 2022-09-10 DIAGNOSIS — M25641 Stiffness of right hand, not elsewhere classified: Secondary | ICD-10-CM

## 2022-09-10 NOTE — Therapy (Signed)
[facility] [address], [place], [address] Phone: [phone]   Fax:  [phone]  Occupational Therapy Treatment  Patient Details  Name: Austin Blankenship MRN: [id number] Date of Birth: January 02, 1986 Referring Provider (OT): Muldraugh PA   Encounter Date: 09/10/2022   OT End of Session - 09/10/22 1321     Visit Number 12    Number of Visits 24    Date for OT Re-Evaluation 10/14/22    OT Start Time 1157    OT Stop Time 1245    OT Time Calculation (min) 48 min    Activity Tolerance Patient tolerated treatment well    Behavior During Therapy [facility] for tasks assessed/performed             Past Medical History:  Diagnosis Date   Medical history non-contributory     Past Surgical History:  Procedure Laterality Date   CARPAL TUNNEL RELEASE Right 06/28/2022   Procedure: CARPAL TUNNEL RELEASE;  Surgeon: [name], MD;  Location: [facility];  Service: Orthopedics;  Laterality: Right;   FRACTURE SURGERY     left leg.  age 64 or 77   OPEN REDUCTION INTERNAL FIXATION (ORIF) DISTAL RADIAL FRACTURE Right 06/28/2022   Procedure: OPEN REDUCTION INTERNAL FIXATION (ORIF) DISTAL RADIUS FRACTURE;  Surgeon: [name], MD;  Location: [facility];  Service: Orthopedics;  Laterality: Right;   TONSILLECTOMY     and Adenoids as child    There were no vitals filed for this visit.   Subjective Assessment - 09/10/22 1318     Subjective  I have done the putty , rotation of forerarm but still feels that click - done some sweeping and cooking- we are doing the scar massage    Pertinent History 07/12/22 ORTHo NOTE- Austin Blankenship is a 37 y.o. male who presents today for suture removal status post a open reduction internal fixation of right distal radius fracture in addition to a carpal tunnel release. The patient suffered a right distal radius fracture and underwent surgery with Dr. Rosita Kea on 06/28/2022. The patient had a  complex distal radius fracture which did require use of both the volar and dorsal plate. The patient also suffered acute carpal tunnel at the time the injury and did have to undergo a carpal tunnel release. The patient was evaluated for skin check and was placed in a Velcro wrist splint, he was instructed to begin using his hand and flexing and extending his fingers. The patient presents today with still moderate swelling throughout the right hand, he does still report a burning in the median nerve distribution but does feel that the sensation is improving. He has been working on trying to make a full fist. He has been nonweightbearing to the right upper extremity. The patient is taking hydrocodone as needed for discomfort. Pain score today is a 7 out of 10. 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 No/denies                [facility] OT Assessment - 09/10/22 0001       AROM   Right Forearm Supination 75 Degrees   85 in session   Right Wrist Extension 48 Degrees   55 after CPM   Right Wrist Flexion 55 Degrees  Strength   Right Hand Grip (lbs) 20      Right Hand AROM   R Index  MCP 0-90 90 Degrees    R Index PIP 0-100 100 Degrees    R Long  MCP 0-90 90 Degrees    R Long PIP 0-100 100 Degrees    R Ring  MCP 0-90 90 Degrees    R Ring PIP 0-100 100 Degrees    R Little  MCP 0-90 90 Degrees    R Little PIP 0-100 95 Degrees             Reinforce for pt to focus on supination , wrist extention and flexion stretches Followed by yellow flex bar And to see if he has 2 lbs weight at home to upgrade to Needed review of use of flexbar for supination Pt using Benik neoprene for wrist during day          OT Treatments/Exercises (OP) - 09/10/22 0001       RUE Paraffin   RUE Paraffin Location Hand   wrist   Comments sup stretch  , and with elbow flexion             Patient to do 2 times a day contrast using heating pads and cold pack Can use LMB splint for PIP extension on 3rd  during contrast  as well as supination  stretch with elbow flexion during heat      Soft tissue massage  scar massage Cont cica scar pad night time     Done [facility] and CT spreads with thumb PA and RA AAROM done -and reviewed  Some light traction to PIP's with extention stretch      Reinforce extention of digits rolling over red roller and on table = pressure behind PIP's  PROM composite fist to digits     Cont med firm putty for gripping , lat and 3 point pinch- 3 sec pain free into putty Review again need mod A  2 x 12 reps increase to  2 x day  Rolling over putty for digits extention    Change AAROM /PROM on yellow power web - 20 reps hold 5 sec for flexion, ext of wrist      SUpination PROM done by OT  Followed by yellow flex bar- from neutral into sup - with Benik wrap on  15 reps \\2  x day    CPM for wrist extention - 2 x 200 sec  Increase to 55 extention  RECOMMEND for pt to do during day - standing , light support or leaning on table, counter , back of chair and wall to facilitate wrist extention              OT Education - 09/10/22 1321     Education Details progress and HEP changes    Person(s) Educated Patient    Methods Explanation;Demonstration;Tactile cues;Verbal cues;Handout    Comprehension Verbal cues required;Returned demonstration;Verbalized understanding              OT Short Term Goals - 09/04/22 1045       OT SHORT TERM GOAL #1   Title Patient to be independent in home program to decrease edema and increase motion for pain and to be able to lay fingers and wrist straight on table.    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  Flexor contracture shoulder with pain with attempts of passive range of motion at PIP [name]  Unable to do palmar ABD-  thumb resting more in  palmar-coming in patient keep wrist in flexion even in brace.    Time 2    Period Weeks    Status On-going    Target Date 08/05/22      OT SHORT TERM GOAL #2   Title Patient demo understanding to wear wrist correctly and position right arm during the day correct to decrease flexion contractures in digits and  wrist    Baseline Patient wrist hand and wrist splint in flexion; fingers in flexion contractures at the MCP and PIPs-unable to straighten PIPs with passive and active range of motion    Time 3    Period Weeks    Status Achieved    Target Date 08/12/22               OT Long Term Goals - 09/04/22 1045       OT LONG TERM GOAL #1   Title Digit flexion increased for patient to touch palm without increased symptoms to initiate using it in ADLs    Baseline MC flexion 55 to 60 degrees, PIP's 45 to 60 degrees -unable to use hand in any bathing or dressing or eating.  09/02/22:  Pt now able to use hand with bathing, dressing and grooming and still working on ROM    Time 4    Period Weeks    Status On-going    Target Date 10/14/22      OT LONG TERM GOAL #2   Title Right wrist active range of motion increased to within functional limits for patient to use right hand in 50% of ADLs    Baseline Right wrist extension 5 degrees and flexion 30 degrees, ulnar deviation 5 degrees and radial deviation 12-not using the right hand or wrist activities7/15/24:  Pt using right hand and wrist in activities but still working on increasing active ROM    Time 6    Period Weeks    Status Partially Met    Target Date 10/14/22      OT LONG TERM GOAL #3   Title Right forearm supination pronation increased to within functional limits for patient to turn doorknob with minimal symptoms    Baseline Patient decreased greatly in supination 50 and pronation 70, 09/02/22:  continued improvement in sup/pro but still working on end ranges, sup 75, pro 90 degrees    Time 6    Period Weeks    Status On-going     Target Date 10/14/22      OT LONG TERM GOAL #4   Title Right digit extension as well as thumb palmar radial abduction within functional limits to put hand in pocket and don gloves with no issues    Baseline Palmar abduction 48 and radial abduction 40 degrees extension limited -10 to -15 at [facility], PIPs -30 to -50 09/02/22:  Pt continues to work towards improving ROM of digits.    Time 5    Period Weeks    Status On-going    Target Date 10/14/22      OT LONG TERM GOAL #5   Title Strength in right hand and wrist for patient to be able to carry 5 pounds with no increase symptoms, push and pull heavy door and initiate using tools    Baseline Patient 3-1/2 weeks postop.  Steri-Strips removed today-severe edema and stiffness in right hand and wrist. 09/02/22:  increased strength in right hand, 10# grip last measured, difficulty with carrying heavier items.    Time 12  Period Weeks    Status On-going    Target Date 10/14/22                   Plan - 09/10/22 1321     Clinical Impression Statement Patient presented OT evaluation with diagnosis of s/p  right distal radius fracture with volar and dorsal plate, and carpal tunnel release by Dr. Rosita Kea 06/28/2022.  PT about 2 1/2 months s/p - pt making steady progres in digits flexion, forearm supination and wrist flexion/extention.   Pt to cont focus on scar mobs to decrease adhesion and increase wrist flexion, ext and supination. Adhesions limiting his  flexion and extention of wrist. . Pt to focus on PROM wrist extention, flexion and supination at home.   Pt to cont with HEP  to use power web for stretches and flex bar for strengthening -  borrowed fot pt to use at home. Cont PROM for digits composite flexion while maintaining digits extention.  Pt to focus on PROM thumb IP and digits  PIP/DIP flexion prior to composite flexion. Pt to keep pain under 2/10. Upgrade pt last visit to [employer] putty for grip and prehension strenghth. Do report  increase use of R hand functionally at home.  Abld to push and pull heavy door- and carry 8 lbs with slight pull. Patient limited severely in functional use of right hand and wrist in ADLs and IADLs. but improving.   Patient can benefit from skilled OT services to decrease scar tissue, and pain and increase motion and strength to return to prior level of function.    OT Occupational Profile and History Problem Focused Assessment - Including review of records relating to presenting problem    Occupational performance deficits (Please refer to evaluation for details): ADL's;IADL's;Work;Play;Social Participation;Leisure    Body Structure / Function / Physical Skills ADL;Decreased knowledge of precautions;Flexibility;ROM;UE functional use;Scar mobility;FMC;Dexterity;Sensation;Strength;Pain;IADL;Edema;Coordination    Rehab Potential Fair    Clinical Decision Making Limited treatment options, no task modification necessary    Comorbidities Affecting Occupational Performance: None    Modification or Assistance to Complete Evaluation  No modification of tasks or assist necessary to complete eval    OT Frequency 2x / week    OT Duration 12 weeks    OT Treatment/Interventions Self-care/ADL training;Therapeutic exercise;Patient/family education;Splinting;Paraffin;Fluidtherapy;Contrast Bath;DME and/or AE instruction;Manual Therapy;Passive range of motion;Scar mobilization;Therapeutic activities    Consulted and Agree with Plan of Care Patient             Patient will benefit from skilled therapeutic intervention in order to improve the following deficits and impairments:   Body Structure / Function / Physical Skills: ADL, Decreased knowledge of precautions, Flexibility, ROM, UE functional use, Scar mobility, FMC, Dexterity, Sensation, Strength, Pain, IADL, Edema, Coordination       Visit Diagnosis: Muscle weakness (generalized)  Stiffness of right hand, not elsewhere classified  Pain in right  wrist  Scar condition and fibrosis of skin  Stiffness of right wrist, not elsewhere classified    Problem List There are no problems to display for this patient.   Oletta Cohn, OTR/L,CLT 09/10/2022, 1:46 PM   [facility] [address] [place], [address] Phone: [phone]   Fax:  [phone]  Name: HERACLIO SEIDMAN MRN: [id number] Date of Birth: February 17, 1986

## 2022-09-13 ENCOUNTER — Ambulatory Visit: Payer: 59 | Admitting: Occupational Therapy

## 2022-09-13 DIAGNOSIS — M25531 Pain in right wrist: Secondary | ICD-10-CM

## 2022-09-13 DIAGNOSIS — L905 Scar conditions and fibrosis of skin: Secondary | ICD-10-CM

## 2022-09-13 DIAGNOSIS — M6281 Muscle weakness (generalized): Secondary | ICD-10-CM

## 2022-09-13 DIAGNOSIS — R600 Localized edema: Secondary | ICD-10-CM | POA: Diagnosis not present

## 2022-09-13 DIAGNOSIS — M79644 Pain in right finger(s): Secondary | ICD-10-CM

## 2022-09-13 DIAGNOSIS — M25641 Stiffness of right hand, not elsewhere classified: Secondary | ICD-10-CM

## 2022-09-13 DIAGNOSIS — M25631 Stiffness of right wrist, not elsewhere classified: Secondary | ICD-10-CM

## 2022-09-14 ENCOUNTER — Encounter: Payer: Self-pay | Admitting: Occupational Therapy

## 2022-09-14 NOTE — Therapy (Signed)
[facility] [address] [place], [address] Phone: [phone]   Fax:  [phone]  Occupational Therapy Treatment  Patient Details  Name: Austin Blankenship MRN: [id number] Date of Birth: 1985/10/02 Referring Provider (OT): Bellaire PA   Encounter Date: 09/13/2022   OT End of Session - 09/16/22 0929     Visit Number 13    Number of Visits 24    Date for OT Re-Evaluation 10/14/22    OT Start Time 1115    OT Stop Time 1209    OT Time Calculation (min) 54 min    Activity Tolerance Patient tolerated treatment well    Behavior During Therapy [facility] for tasks assessed/performed             Past Medical History:  Diagnosis Date   Medical history non-contributory     Past Surgical History:  Procedure Laterality Date   CARPAL TUNNEL RELEASE Right 06/28/2022   Procedure: CARPAL TUNNEL RELEASE;  Surgeon: [name], MD;  Location: [facility];  Service: Orthopedics;  Laterality: Right;   FRACTURE SURGERY     left leg.  age 67 or 37   OPEN REDUCTION INTERNAL FIXATION (ORIF) DISTAL RADIAL FRACTURE Right 06/28/2022   Procedure: OPEN REDUCTION INTERNAL FIXATION (ORIF) DISTAL RADIUS FRACTURE;  Surgeon: [name], MD;  Location: [facility];  Service: Orthopedics;  Laterality: Right;   TONSILLECTOMY     and Adenoids as child    There were no vitals filed for this visit.   Subjective Assessment - 09/16/22 0929     Subjective  Pt reports he is doing well, trying to do as much as he can with exercises at home, does not have a 2# weight    Pertinent History 07/12/22 ORTHo NOTE- Beorn Portman is a 37 y.o. male who presents today for suture removal status post a open reduction internal fixation of right distal radius fracture in addition to a carpal tunnel release. The patient suffered a right distal radius fracture and underwent surgery with Dr. Rosita Kea on 06/28/2022. The patient had a complex distal radius  fracture which did require use of both the volar and dorsal plate. The patient also suffered acute carpal tunnel at the time the injury and did have to undergo a carpal tunnel release. The patient was evaluated for skin check and was placed in a Velcro wrist splint, he was instructed to begin using his hand and flexing and extending his fingers. The patient presents today with still moderate swelling throughout the right hand, he does still report a burning in the median nerve distribution but does feel that the sensation is improving. He has been working on trying to make a full fist. He has been nonweightbearing to the right upper extremity. The patient is taking hydrocodone as needed for discomfort. Pain score today is a 7 out of 10. 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 No/denies    Pain Score 0-No pain    Pain Onset More than a month ago                         Pt arrived with HEP tools yellow flex bar and cando web. Contrast:   Pt seen for use of contrast for  edema control, decrease pain and increase motion to right hand and wrist for 8 mins total.  .   Manual therapy: Following contrast, therapist performed soft tissue massage for [facility] and CT spreads with thumb PA and RA, AAROM, light traction of PIPs with extension stretch. Use of Graston tool #2 for sweeping and brushing over volar and radial forearm wrist and hand prior to therex. Pt seen for soft tissue massage for edema as well as scar massage Use of xtractor at distal wrist scar and CT scar with digits AROM  Use of mini massager to decrease scar tissue and promote healing/greater ROM    Therapeutic Exercise:  Extension of digits rolling over red roller and on table = pressure behind PIP's  tendon glides and flexion of digits PROM to DIP and PIP flexion prior to   Intrinsic fist - PROM  PROM over edge of table ulnar /radial deviation   flexion, extension 12 reps over edge of table  prayer stretch, table slides 20 reps    CPM for wrist extension 2 x 200 sec- increase to 48 degrees  Followed by 2 lbs place and hold wrist ext 12 reps Supination PROM done by OT  2 lbs on thigh 12 reps - light stretch relax into supination min A  And 2 lbs dumbbell for RD/ UD over arm rest - 12 reps  Opposition - thumb IP and MC flexion PROM 12 reps   AAROM /PROM on yellow power web - 20 reps hold 5 sec for flexion, ext of wrist   yellow flex bar- from neutral into sup - with Benik wrap on  15 reps \\2  x day     OT Education - 09/16/22 0929     Education Details progress and HEP changes    Person(s) Educated Patient    Methods Explanation;Demonstration;Tactile cues;Verbal cues;Handout    Comprehension Verbal cues required;Returned demonstration;Verbalized understanding              OT Short Term Goals - 09/04/22 1045       OT SHORT TERM GOAL #1   Title Patient to be independent in home program to decrease edema and increase motion for pain and to be able to lay fingers and wrist straight on table.    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  Flexor contracture shoulder with pain with attempts of passive range of motion at PIP [name]  Unable to do palmar ABD- thumb resting more in palmar-coming in patient keep wrist in flexion even in brace.    Time 2    Period Weeks    Status On-going    Target Date 08/05/22      OT SHORT TERM GOAL #2   Title Patient demo understanding to wear wrist correctly and position right arm during the day correct to decrease flexion contractures in digits and  wrist    Baseline Patient wrist hand and wrist splint in flexion; fingers in flexion contractures at the MCP and PIPs-unable to straighten PIPs with passive and active range of motion    Time 3    Period Weeks    Status Achieved    Target Date 08/12/22                OT Long Term Goals - 09/04/22 1045       OT LONG TERM GOAL #1   Title Digit flexion increased for patient to touch palm without increased symptoms to initiate using it in ADLs    Baseline  MC flexion 55 to 60 degrees, PIP's 45 to 60 degrees -unable to use hand in any bathing or dressing or eating.  09/02/22:  Pt now able to use hand with bathing, dressing and grooming and still working on ROM    Time 4    Period Weeks    Status On-going    Target Date 10/14/22      OT LONG TERM GOAL #2   Title Right wrist active range of motion increased to within functional limits for patient to use right hand in 50% of ADLs    Baseline Right wrist extension 5 degrees and flexion 30 degrees, ulnar deviation 5 degrees and radial deviation 12-not using the right hand or wrist activities7/15/24:  Pt using right hand and wrist in activities but still working on increasing active ROM    Time 6    Period Weeks    Status Partially Met    Target Date 10/14/22      OT LONG TERM GOAL #3   Title Right forearm supination pronation increased to within functional limits for patient to turn doorknob with minimal symptoms    Baseline Patient decreased greatly in supination 50 and pronation 70, 09/02/22:  continued improvement in sup/pro but still working on end ranges, sup 75, pro 90 degrees    Time 6    Period Weeks    Status On-going    Target Date 10/14/22      OT LONG TERM GOAL #4   Title Right digit extension as well as thumb palmar radial abduction within functional limits to put hand in pocket and don gloves with no issues    Baseline Palmar abduction 48 and radial abduction 40 degrees extension limited -10 to -15 at [facility], PIPs -30 to -50 09/02/22:  Pt continues to work towards improving ROM of digits.    Time 5    Period Weeks    Status On-going    Target Date 10/14/22      OT LONG TERM GOAL #5   Title Strength in right hand and wrist for patient to be able to carry 5 pounds with no  increase symptoms, push and pull heavy door and initiate using tools    Baseline Patient 3-1/2 weeks postop.  Steri-Strips removed today-severe edema and stiffness in right hand and wrist. 09/02/22:  increased strength in right hand, 10# grip last measured, difficulty with carrying heavier items.    Time 12    Period Weeks    Status On-going    Target Date 10/14/22                   Plan - 09/16/22 0929     Clinical Impression Statement Patient presented OT evaluation with diagnosis of s/p  right distal radius fracture with volar and dorsal plate, and carpal tunnel release by Dr. Rosita Kea 06/28/2022.  PT about 2 1/2 months s/p - pt making steady progres in digits flexion, forearm supination and wrist flexion/extention.   Pt to cont focus on scar mobs to decrease adhesion and increase wrist flexion, ext and supination. Adhesions limiting his  flexion and extention of wrist. . Pt to focus on PROM wrist extension, flexion and supination at home.   Pt to cont with HEP  to use power web for stretches and flex bar for strengthening -  will continue to  borrow for pt to use at home. Cont PROM for digits composite flexion while maintaining digits extension.  Pt to focus on PROM thumb IP  and digits  PIP/DIP flexion prior to composite flexion. Pt to keep pain under 2/10. Upgraded to [employer] putty this week for grip and prehension strength. Pt reports increase use of R hand functionally at home.  Able to push and pull heavy door- and carry 8 lbs with slight pull. Pt continues to progress in all areas but remains limited with wrist and forearm ROM.  Discussed options for using 2# weight at home for exercises, he currently does not have 2# at home. Patient limited severely in functional use of right hand and wrist in ADLs and IADLs. but improving.   Patient can benefit from skilled OT services to decrease scar tissue, and pain and increase motion and strength to return to prior level of function.    OT  Occupational Profile and History Problem Focused Assessment - Including review of records relating to presenting problem    Occupational performance deficits (Please refer to evaluation for details): ADL's;IADL's;Work;Play;Social Participation;Leisure    Body Structure / Function / Physical Skills ADL;Decreased knowledge of precautions;Flexibility;ROM;UE functional use;Scar mobility;FMC;Dexterity;Sensation;Strength;Pain;IADL;Edema;Coordination    Rehab Potential Fair    Clinical Decision Making Limited treatment options, no task modification necessary    Comorbidities Affecting Occupational Performance: None    Modification or Assistance to Complete Evaluation  No modification of tasks or assist necessary to complete eval    OT Frequency 2x / week    OT Duration 12 weeks    OT Treatment/Interventions Self-care/ADL training;Therapeutic exercise;Patient/family education;Splinting;Paraffin;Fluidtherapy;Contrast Bath;DME and/or AE instruction;Manual Therapy;Passive range of motion;Scar mobilization;Therapeutic activities    Consulted and Agree with Plan of Care Patient             Patient will benefit from skilled therapeutic intervention in order to improve the following deficits and impairments:   Body Structure / Function / Physical Skills: ADL, Decreased knowledge of precautions, Flexibility, ROM, UE functional use, Scar mobility, FMC, Dexterity, Sensation, Strength, Pain, IADL, Edema, Coordination       Visit Diagnosis: Muscle weakness (generalized)  Stiffness of right hand, not elsewhere classified  Pain in right wrist  Scar condition and fibrosis of skin  Stiffness of right wrist, not elsewhere classified  Localized edema  Pain in right finger(s)    Problem List There are no problems to display for this patient.  Galvin Aversa Cornelius Moras, OTR/L, CLT  Alizaya Oshea, OT 09/17/2022, 9:44 AM  [facility] [address] [place], [address] Phone: [phone]   Fax:  [phone]  Name: Austin Blankenship MRN: [id number] Date of Birth: Aug 27, 1985

## 2022-09-17 ENCOUNTER — Ambulatory Visit: Payer: 59 | Admitting: Occupational Therapy

## 2022-09-17 DIAGNOSIS — M25531 Pain in right wrist: Secondary | ICD-10-CM

## 2022-09-17 DIAGNOSIS — L905 Scar conditions and fibrosis of skin: Secondary | ICD-10-CM

## 2022-09-17 DIAGNOSIS — R600 Localized edema: Secondary | ICD-10-CM | POA: Diagnosis not present

## 2022-09-17 DIAGNOSIS — M25641 Stiffness of right hand, not elsewhere classified: Secondary | ICD-10-CM

## 2022-09-17 DIAGNOSIS — M6281 Muscle weakness (generalized): Secondary | ICD-10-CM

## 2022-09-17 DIAGNOSIS — M25631 Stiffness of right wrist, not elsewhere classified: Secondary | ICD-10-CM
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
## 2022-09-19 ENCOUNTER — Ambulatory Visit: Payer: 59 | Attending: Student | Admitting: Occupational Therapy

## 2022-09-19 DIAGNOSIS — L905 Scar conditions and fibrosis of skin: Secondary | ICD-10-CM | POA: Insufficient documentation

## 2022-09-19 DIAGNOSIS — M6281 Muscle weakness (generalized): Secondary | ICD-10-CM | POA: Insufficient documentation

## 2022-09-19 DIAGNOSIS — M79644 Pain in right finger(s): Secondary | ICD-10-CM | POA: Insufficient documentation

## 2022-09-19 DIAGNOSIS — R600 Localized edema: Secondary | ICD-10-CM | POA: Insufficient documentation

## 2022-09-19 DIAGNOSIS — M25531 Pain in right wrist: Secondary | ICD-10-CM | POA: Insufficient documentation

## 2022-09-19 DIAGNOSIS — M25631 Stiffness of right wrist, not elsewhere classified: Secondary | ICD-10-CM | POA: Insufficient documentation

## 2022-09-19 DIAGNOSIS — M25641 Stiffness of right hand, not elsewhere classified: Secondary | ICD-10-CM | POA: Insufficient documentation

## 2022-09-24 ENCOUNTER — Ambulatory Visit: Payer: 59 | Admitting: Occupational Therapy

## 2022-09-24 ENCOUNTER — Encounter: Payer: Self-pay | Admitting: Orthopedic Surgery

## 2022-09-24 ENCOUNTER — Other Ambulatory Visit: Payer: Self-pay | Admitting: Orthopedic Surgery

## 2022-09-24 DIAGNOSIS — L905 Scar conditions and fibrosis of skin: Secondary | ICD-10-CM | POA: Diagnosis present

## 2022-09-24 DIAGNOSIS — M25641 Stiffness of right hand, not elsewhere classified: Secondary | ICD-10-CM

## 2022-09-24 DIAGNOSIS — R600 Localized edema: Secondary | ICD-10-CM | POA: Diagnosis present

## 2022-09-24 DIAGNOSIS — M25631 Stiffness of right wrist, not elsewhere classified: Secondary | ICD-10-CM

## 2022-09-24 DIAGNOSIS — M79644 Pain in right finger(s): Secondary | ICD-10-CM

## 2022-09-24 DIAGNOSIS — M6281 Muscle weakness (generalized): Secondary | ICD-10-CM | POA: Diagnosis not present

## 2022-09-24 DIAGNOSIS — M25531 Pain in right wrist: Secondary | ICD-10-CM | POA: Diagnosis present

## 2022-09-24 NOTE — Therapy (Signed)
[facility] [address] [place], [address] Phone: [phone]   Fax:  [phone]  Occupational Therapy Treatment  Patient Details  Name: Austin Blankenship MRN: [id number] Date of Birth: [birth year] Referring Provider (OT): Austin Blankenship   Encounter Date: 09/24/2022   OT End of Session - 09/24/22 1213     Visit Number 15    Number of Visits 24    Date for OT Re-Evaluation 10/14/22    OT Start Time 1210    OT Stop Time 1247    OT Time Calculation (min) 37 min    Activity Tolerance Patient tolerated treatment well;No increased pain    Behavior During Therapy [facility] for tasks assessed/performed             Past Medical History:  Diagnosis Date   Medical history non-contributory     Past Surgical History:  Procedure Laterality Date   CARPAL TUNNEL RELEASE Right 06/28/2022   Procedure: CARPAL TUNNEL RELEASE;  Surgeon: Austin Bucker, MD;  Location: [facility];  Service: Orthopedics;  Laterality: Right;   FRACTURE SURGERY     left leg.  age 50 or 41   OPEN REDUCTION INTERNAL FIXATION (ORIF) DISTAL RADIAL FRACTURE Right 06/28/2022   Procedure: OPEN REDUCTION INTERNAL FIXATION (ORIF) DISTAL RADIUS FRACTURE;  Surgeon: Austin Bucker, MD;  Location: [facility];  Service: Orthopedics;  Laterality: Right;   TONSILLECTOMY     and Adenoids as child    There were no vitals filed for this visit.   Subjective Assessment - 09/24/22 1212     Subjective  Dr said plate needs to came out - to decrease risk for tendon rupture- waitng for date- stop doing so much because afraid it will rupture    Pertinent History 07/12/22 ORTHo NOTE- Austin Blankenship is a 37 y.o. male who presents today for suture removal status post a open reduction internal fixation of right distal radius fracture in addition to a carpal tunnel release. The patient suffered a right distal radius fracture and underwent surgery with Dr. Rosita Blankenship on  06/28/2022. The patient had a complex distal radius fracture which did require use of both the volar and dorsal plate. The patient also suffered acute carpal tunnel at the time the injury and did have to undergo a carpal tunnel release. The patient was evaluated for skin check and was placed in a Velcro wrist splint, he was instructed to begin using his hand and flexing and extending his fingers. The patient presents today with still moderate swelling throughout the right hand, he does still report a burning in the median nerve distribution but does feel that the sensation is improving. He has been working on trying to make a full fist. He has been nonweightbearing to the right upper extremity. The patient is taking hydrocodone as needed for discomfort. Pain score today is a 7 out of 10. 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 No/denies                [facility] OT Assessment - 09/24/22 0001       AROM   Right Forearm Pronation 90 Degrees    Right Forearm Supination 75 Degrees   85 after paraffin   Right Wrist Extension 45 Degrees  Right Wrist Flexion 45 Degrees   60 after paraffin   Right Wrist Radial Deviation 25 Degrees    Right Wrist Ulnar Deviation 20 Degrees      Strength   Right Hand Grip (lbs) 30    Right Hand Lateral Pinch 17 lbs    Right Hand 3 Point Pinch 12 lbs    Left Hand Grip (lbs) 85    Left Hand Lateral Pinch 25 lbs    Left Hand 3 Point Pinch 23 lbs                  Pt arrive with reports of fear after ortho visit -that tendons will rupture- plan is for removal of dorsal plate    OT Treatments/Exercises (OP) - 09/24/22 0001       RUE Paraffin   Number Minutes Paraffin 8 Minutes    RUE Paraffin Location Wrist   hand   Comments sup/ wrist ext and flexion stretch prior to ROM            Pt lost  some motion but during paraffin and after work  Done PROM for wrist sup, flexion and extention  CPM for extention 200 sec  Wrist extention on power web- increase to red And then table slides Increase to 85 for sup, flexion 60 and extention 55  Pt to focus on intrinsic fist , composite flexion and putty gripping and 3 point pinch     Focus on sup, wrist ext and flexion stretches  Prior to 2lbs for sup/pro and RD, UD  12 reps           OT Education - 09/24/22 1213     Education Details progress and HEP changes    Person(s) Educated Patient    Methods Explanation;Demonstration;Tactile cues;Verbal cues;Handout    Comprehension Verbal cues required;Returned demonstration;Verbalized understanding              OT Short Term Goals - 09/04/22 1045       OT SHORT TERM GOAL #1   Title Patient to be independent in home program to decrease edema and increase motion for pain and to be able to lay fingers and wrist straight on table.    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  Flexor contracture shoulder with pain with attempts of passive range of motion at PIP [facility].  Unable to do palmar ABD- thumb resting more in palmar-coming in patient keep wrist in flexion even in brace.    Time 2    Period Weeks    Status On-going    Target Date 08/05/22      OT SHORT TERM GOAL #2   Title Patient demo understanding to wear wrist correctly and position right arm during the day correct to decrease flexion contractures in digits and  wrist    Baseline Patient wrist hand and wrist splint in flexion; fingers in flexion contractures at the MCP and PIPs-unable to straighten PIPs with passive and active range of motion    Time 3    Period Weeks    Status Achieved    Target Date 08/12/22               OT Long Term Goals - 09/04/22 1045       OT LONG TERM GOAL #1   Title Digit flexion increased for patient to touch palm without increased symptoms to initiate using it in  ADLs    Baseline MC flexion 55 to 60 degrees, PIP's 45  to 60 degrees -unable to use hand in any bathing or dressing or eating.  09/02/22:  Pt now able to use hand with bathing, dressing and grooming and still working on ROM    Time 4    Period Weeks    Status On-going    Target Date 10/14/22      OT LONG TERM GOAL #2   Title Right wrist active range of motion increased to within functional limits for patient to use right hand in 50% of ADLs    Baseline Right wrist extension 5 degrees and flexion 30 degrees, ulnar deviation 5 degrees and radial deviation 12-not using the right hand or wrist activities7/15/24:  Pt using right hand and wrist in activities but still working on increasing active ROM    Time 6    Period Weeks    Status Partially Met    Target Date 10/14/22      OT LONG TERM GOAL #3   Title Right forearm supination pronation increased to within functional limits for patient to turn doorknob with minimal symptoms    Baseline Patient decreased greatly in supination 50 and pronation 70, 09/02/22:  continued improvement in sup/pro but still working on end ranges, sup 75, pro 90 degrees    Time 6    Period Weeks    Status On-going    Target Date 10/14/22      OT LONG TERM GOAL #4   Title Right digit extension as well as thumb palmar radial abduction within functional limits to put hand in pocket and don gloves with no issues    Baseline Palmar abduction 48 and radial abduction 40 degrees extension limited -10 to -15 at [facility], PIPs -30 to -50 09/02/22:  Pt continues to work towards improving ROM of digits.    Time 5    Period Weeks    Status On-going    Target Date 10/14/22      OT LONG TERM GOAL #5   Title Strength in right hand and wrist for patient to be able to carry 5 pounds with no increase symptoms, push and pull heavy door and initiate using tools    Baseline Patient 3-1/2 weeks postop.  Steri-Strips removed today-severe edema and stiffness in right hand and wrist. 09/02/22:   increased strength in right hand, 10# grip last measured, difficulty with carrying heavier items.    Time 12    Period Weeks    Status On-going    Target Date 10/14/22                   Plan - 09/24/22 1213     Clinical Impression Statement Patient presented OT evaluation with diagnosis of s/p  right distal radius fracture with volar and dorsal plate, and carpal tunnel release by Dr. Rosita [facility] 06/28/2022.  Pt about 3 months months s/p -Pt making steady progress in edema , pain and ROM and strength but struggle to maintain and progress in sup and wrist extention mostly more than flexion- pt did see Surgeon last week - plan is to remove dorsal plate - pt report he did not do as much with HEP because of fear of rupturing tendons - pt done good in session and educated pt on plan for surgery and also to cont with HEP until then- made good progress today in session again in sup/ flexion and extentoin - grip increased - pt to pick up 2 lbs weight for HEP- Patient limited in functional use of right hand and  wrist in ADLs and IADLs. but improving.   Patient can benefit from skilled OT services to decrease scar tissue, and pain and increase motion and strength to return to prior level of function.    OT Occupational Profile and History Problem Focused Assessment - Including review of records relating to presenting problem    Occupational performance deficits (Please refer to evaluation for details): ADL's;IADL's;Work;Play;Social Participation;Leisure    Body Structure / Function / Physical Skills ADL;Decreased knowledge of precautions;Flexibility;ROM;UE functional use;Scar mobility;FMC;Dexterity;Sensation;Strength;Pain;IADL;Edema;Coordination    Rehab Potential Fair    Clinical Decision Making Limited treatment options, no task modification necessary    Comorbidities Affecting Occupational Performance: None    Modification or Assistance to Complete Evaluation  No modification of tasks or assist necessary  to complete eval    OT Frequency 2x / week    OT Duration 12 weeks    OT Treatment/Interventions Self-care/ADL training;Therapeutic exercise;Patient/family education;Splinting;Paraffin;Fluidtherapy;Contrast Bath;DME and/or AE instruction;Manual Therapy;Passive range of motion;Scar mobilization;Therapeutic activities    Consulted and Agree with Plan of Care Patient             Patient will benefit from skilled therapeutic intervention in order to improve the following deficits and impairments:   Body Structure / Function / Physical Skills: ADL, Decreased knowledge of precautions, Flexibility, ROM, UE functional use, Scar mobility, FMC, Dexterity, Sensation, Strength, Pain, IADL, Edema, Coordination       Visit Diagnosis: Muscle weakness (generalized)  Stiffness of right hand, not elsewhere classified  Pain in right wrist  Scar condition and fibrosis of skin  Pain in right finger(s)  Stiffness of right wrist, not elsewhere classified  Localized edema    Problem List There are no problems to display for this patient.   Oletta Cohn, OTR/L,CLT 09/24/2022, 12:53 PM  [facility] [address] [place], [address] Phone: [phone]   Fax:  [phone]  Name: Austin [facility] MRN: [id number] Date of Birth: 08/16/1985

## 2022-09-26 ENCOUNTER — Ambulatory Visit: Payer: 59 | Admitting: Occupational Therapy

## 2022-09-26 DIAGNOSIS — M25641 Stiffness of right hand, not elsewhere classified: Secondary | ICD-10-CM

## 2022-09-26 DIAGNOSIS — M25631 Stiffness of right wrist, not elsewhere classified: Secondary | ICD-10-CM

## 2022-09-26 DIAGNOSIS — M6281 Muscle weakness (generalized): Secondary | ICD-10-CM

## 2022-09-26 DIAGNOSIS — L905 Scar conditions and fibrosis of skin: Secondary | ICD-10-CM

## 2022-09-26 DIAGNOSIS — M25531 Pain in right wrist: Secondary | ICD-10-CM

## 2022-09-26 NOTE — Therapy (Signed)
[facility] [address] [place], [address] Phone: [phone]   Fax:  [phone]  Occupational Therapy Treatment  Patient Details  Name: Austin [facility] MRN: [id number] Date of Birth: 1985/04/11 Referring Provider (OT): [place] PA   Encounter Date: 09/26/2022   OT End of Session - 09/26/22 1319     Visit Number 16    Number of Visits 24    Date for OT Re-Evaluation 10/14/22    OT Start Time 1228    OT Stop Time 1313    OT Time Calculation (min) 45 min    Activity Tolerance Patient tolerated treatment well    Behavior During Therapy [facility] for tasks assessed/performed             Past Medical History:  Diagnosis Date   Medical history non-contributory     Past Surgical History:  Procedure Laterality Date   CARPAL TUNNEL RELEASE Right 06/28/2022   Procedure: CARPAL TUNNEL RELEASE;  Surgeon: Austin Bucker, MD;  Location: [facility];  Service: Orthopedics;  Laterality: Right;   FRACTURE SURGERY     left leg.  age 79 or 56   OPEN REDUCTION INTERNAL FIXATION (ORIF) DISTAL RADIAL FRACTURE Right 06/28/2022   Procedure: OPEN REDUCTION INTERNAL FIXATION (ORIF) DISTAL RADIUS FRACTURE;  Surgeon: Austin Bucker, MD;  Location: [facility];  Service: Orthopedics;  Laterality: Right;   TONSILLECTOMY     and Adenoids as child    There were no vitals filed for this visit.   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 No/denies                [facility] OT Assessment - 09/26/22 0001       AROM   Right Forearm Pronation 90 Degrees    Right Forearm Supination 85 Degrees    Right Wrist Extension 50 Degrees     Right Wrist Flexion 60 Degrees    Right Wrist Radial Deviation 30 Degrees    Right Wrist Ulnar Deviation 25 Degrees      Strength   Right Hand Grip (lbs) 39    Right Hand Lateral Pinch 18 lbs    Right Hand 3 Point Pinch 14 lbs    Left Hand Grip (lbs) 85    Left Hand Lateral Pinch 25 lbs    Left Hand 3 Point Pinch 23 lbs             Pt made some good progress since last session earlier this week  Dorsal wrist pain with AROM and 1 lbs weight for wrist extention  Pt to cont to address scar massage  Next Tues - 13th Aug scheduled for dorsal plate removal  Per Dr. Rosita [facility] patient to follow-up the next week after surgery to continue range of motion.         OT Treatments/Exercises (OP) - 09/26/22 0001       RUE Paraffin   Number Minutes Paraffin 8 Minutes    RUE Paraffin Location Wrist   hand   Comments sup/wrist ext and flexion stretches  Done PROM for wrist sup, flexion and extention  CPM for extention 200 sec  Place and hold AROM wrist extention with loose fist  1lbs place and hold  2 lbs for sup/pro PROM end range 15 reps 2 lbs 15 reps RD,UD  Pt to focus on intrinsic fist slight pull, composite flexion and putty gripping and 3 point pinch       Focus on sup, wrist ext and flexion stretches  And composite fist 12 reps         OT Education - 09/26/22 1319     Education Details progress and HEP changes    Person(s) Educated Patient    Methods Explanation;Demonstration;Tactile cues;Verbal cues;Handout    Comprehension Verbal cues required;Returned demonstration;Verbalized understanding              OT Short Term Goals - 09/04/22 1045       OT SHORT TERM GOAL #1   Title Patient to be independent in home program to decrease edema and increase motion for pain and to be able to lay fingers and wrist straight on table.    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  Flexor contracture shoulder with pain with attempts of passive  range of motion at PIP [facility]'s.  Unable to do palmar ABD- thumb resting more in palmar-coming in patient keep wrist in flexion even in brace.    Time 2    Period Weeks    Status On-going    Target Date 08/05/22      OT SHORT TERM GOAL #2   Title Patient demo understanding to wear wrist correctly and position right arm during the day correct to decrease flexion contractures in digits and  wrist    Baseline Patient wrist hand and wrist splint in flexion; fingers in flexion contractures at the MCP and PIPs-unable to straighten PIPs with passive and active range of motion    Time 3    Period Weeks    Status Achieved    Target Date 08/12/22               OT Long Term Goals - 09/04/22 1045       OT LONG TERM GOAL #1   Title Digit flexion increased for patient to touch palm without increased symptoms to initiate using it in ADLs    Baseline MC flexion 55 to 60 degrees, PIP's 45 to 60 degrees -unable to use hand in any bathing or dressing or eating.  09/02/22:  Pt now able to use hand with bathing, dressing and grooming and still working on ROM    Time 4    Period Weeks    Status On-going    Target Date 10/14/22      OT LONG TERM GOAL #2   Title Right wrist active range of motion increased to within functional limits for patient to use right hand in 50% of ADLs    Baseline Right wrist extension 5 degrees and flexion 30 degrees, ulnar deviation 5 degrees and radial deviation 12-not using the right hand or wrist activities7/15/24:  Pt using right hand and wrist in activities but still working on increasing active ROM    Time 6    Period Weeks    Status Partially Met    Target Date 10/14/22      OT LONG TERM GOAL #3   Title Right forearm supination pronation increased to within functional limits for patient to turn doorknob with minimal symptoms    Baseline Patient decreased  greatly in supination 50 and pronation 70, 09/02/22:  continued improvement in sup/pro but still working on  end ranges, sup 75, pro 90 degrees    Time 6    Period Weeks    Status On-going    Target Date 10/14/22      OT LONG TERM GOAL #4   Title Right digit extension as well as thumb palmar radial abduction within functional limits to put hand in pocket and don gloves with no issues    Baseline Palmar abduction 48 and radial abduction 40 degrees extension limited -10 to -15 at [facility], PIPs -30 to -50 09/02/22:  Pt continues to work towards improving ROM of digits.    Time 5    Period Weeks    Status On-going    Target Date 10/14/22      OT LONG TERM GOAL #5   Title Strength in right hand and wrist for patient to be able to carry 5 pounds with no increase symptoms, push and pull heavy door and initiate using tools    Baseline Patient 3-1/2 weeks postop.  Steri-Strips removed today-severe edema and stiffness in right hand and wrist. 09/02/22:  increased strength in right hand, 10# grip last measured, difficulty with carrying heavier items.    Time 12    Period Weeks    Status On-going    Target Date 10/14/22                   Plan - 09/26/22 1319     Clinical Impression Statement Patient presented OT evaluation with diagnosis of s/p  right distal radius fracture with volar and dorsal plate, and carpal tunnel release by Dr. Rosita [facility] 06/28/2022.  Pt about 3 months months s/p -Pt making steady progress in edema , pain and ROM and strength but struggle to maintain and progress in sup and wrist extention mostly more than flexion. Some pain with active wrist extention - pt having surgery Tues the 13th Aug for dorsal plate removal. Per pt for pt to follow up with me to cont OT week after surgery.  Grip and prehension increased since last visit - pt to cont with HEP until surgery. Pt limited in functional use of right hand and wrist in ADLs and IADLs..   Patient can benefit from skilled OT services to decrease scar tissue, and pain and increase motion and strength to return to prior level of function.     OT Occupational Profile and History Problem Focused Assessment - Including review of records relating to presenting problem    Occupational performance deficits (Please refer to evaluation for details): ADL's;IADL's;Work;Play;Social Participation;Leisure    Body Structure / Function / Physical Skills ADL;Decreased knowledge of precautions;Flexibility;ROM;UE functional use;Scar mobility;FMC;Dexterity;Sensation;Strength;Pain;IADL;Edema;Coordination    Rehab Potential Fair    Clinical Decision Making Limited treatment options, no task modification necessary    Comorbidities Affecting Occupational Performance: None    Modification or Assistance to Complete Evaluation  No modification of tasks or assist necessary to complete eval    OT Frequency 2x / week    OT Duration 12 weeks    OT Treatment/Interventions Self-care/ADL training;Therapeutic exercise;Patient/family education;Splinting;Paraffin;Fluidtherapy;Contrast Bath;DME and/or AE instruction;Manual Therapy;Passive range of motion;Scar mobilization;Therapeutic activities    Consulted and Agree with Plan of Care Patient             Patient will benefit from skilled therapeutic intervention in order to improve the following deficits and impairments:   Body Structure / Function / Physical Skills: ADL, Decreased knowledge of  precautions, Flexibility, ROM, UE functional use, Scar mobility, FMC, Dexterity, Sensation, Strength, Pain, IADL, Edema, Coordination       Visit Diagnosis: Muscle weakness (generalized)  Stiffness of right hand, not elsewhere classified  Scar condition and fibrosis of skin  Pain in right wrist  Stiffness of right wrist, not elsewhere classified    Problem List There are no problems to display for this patient.   Oletta Cohn, OTR/L,CLT 09/26/2022, 1:23 PM  [facility] [address] [place], [address] Phone: [phone]   Fax:   [phone]  Name: Austin [facility] MRN: [id number] Date of Birth: [birth year]

## 2022-09-27 NOTE — H&P (Signed)
  Chief Complaint Patient presents with Right Wrist - Pain, Tingling   History of the Present Illness: Austin Blankenship is a 37 y.o. male here today who is follow-up of an ORIF of the distal radius from 06/28/2022. He comes in today for an x-ray follow-up visit.  The patient reports stiffness in his wrist.  I have reviewed past medical, surgical, social and family history, and allergies as documented in the EMR.  Past Medical History: No past medical history on file.  Past Surgical History: Past Surgical History: Procedure Laterality Date OPEN REDUCTION INTERNAL FIXATION (ORIF) DISTAL RADIUS FRACTURE (Right) CARPAL TUNNEL RELEASE Right 06/28/2022 Dr. Rosita Kea FRACTURE SURGERY TONSILLECTOMY  Past Family History: Family History Problem Relation Age of Onset No Known Problems Mother No Known Problems Father  Medications: No current outpatient medications on file.  No current facility-administered medications for this visit.  Allergies: No Known Allergies  Body mass index is 26.5 kg/m.  Review of Systems: A comprehensive 14 point ROS was performed, reviewed, and the pertinent orthopaedic findings are documented in the HPI.  There were no vitals filed for this visit.  General Physical Examination:  General/Constitutional: No apparent distress: well-nourished and well developed. Eyes: Pupils equal, round with synchronous movement. Lungs: Clear to auscultation HEENT: Normal Vascular: No edema, swelling or tenderness, except as noted in detailed exam. Cardiac: Heart rate and rhythm is regular. Integumentary: No impressive skin lesions present, except as noted in detailed exam. Neuro/Psych: Normal mood and affect, oriented to person, place, and time.  Musculoskeletal Examination: Examination of the right wrist, supination from 50 to 80 degrees. Pronation is 90 degrees. Radial deviation is 25 degrees. Ulnar deviation is up to 20 degrees. Grip strength has improved. Extension  and flexion are approximately 50 degrees.  Radiographs:  AP, lateral, and oblique x-rays of the right wrist shows volar and dorsal plates with near anatomic reduction of the joint space. There is still radiolucent line from the fracture towards the radiocarpal joint, but otherwise joint surface looks fairly congruent. X-ray Impression: Nearly healed fracture with dorsal and volar plates.  Assessment: ICD-10-CM 1. Other closed intra-articular fracture of distal end of right radius with routine healing, subsequent encounter S52.571D 2. S/P ORIF (open reduction internal fixation) fracture Z98.890 Z87.81  Plan:  The patient is status post ORIF of the distal radius.  We discussed and reviewed his x-rays which show healing. It was explained that his plate may be causing discomfort and delay recovery. A dorsal plate removal of the right wrist is scheduled for the second half of 09/2022. I will provide a work note.  Document Attestation: Alessandra Grout, have reviewed and updated documentation for [facility], MD, utilizing [employer]uance DAX.   Electronically signed by Marlena Clipper, MD at 09/19/2022 8:16 AM EDT   Reviewed  H+P. No changes noted.

## 2022-10-01 ENCOUNTER — Ambulatory Visit: Payer: 59 | Admitting: Anesthesiology

## 2022-10-01 ENCOUNTER — Ambulatory Visit
Admission: RE | Admit: 2022-10-01 | Discharge: 2022-10-01 | Disposition: A | Discharge status: Home / Self Care | Payer: 59 | Source: Ambulatory Visit | Attending: Orthopedic Surgery | Admitting: Orthopedic Surgery

## 2022-10-01 ENCOUNTER — Other Ambulatory Visit: Payer: Self-pay

## 2022-10-01 ENCOUNTER — Encounter
Admission: RE | Disposition: A | Discharge status: Home / Self Care | Payer: Self-pay | Source: Ambulatory Visit | Attending: Orthopedic Surgery

## 2022-10-01 ENCOUNTER — Encounter: Payer: Self-pay | Admitting: Orthopedic Surgery

## 2022-10-01 DIAGNOSIS — S52571D Other intraarticular fracture of lower end of right radius, subsequent encounter for closed fracture with routine healing: Secondary | ICD-10-CM | POA: Insufficient documentation

## 2022-10-01 DIAGNOSIS — X58XXXD Exposure to other specified factors, subsequent encounter: Secondary | ICD-10-CM | POA: Diagnosis not present

## 2022-10-01 DIAGNOSIS — F17200 Nicotine dependence, unspecified, uncomplicated: Secondary | ICD-10-CM | POA: Diagnosis not present

## 2022-10-01 DIAGNOSIS — Z472 Encounter for removal of internal fixation device: Secondary | ICD-10-CM | POA: Diagnosis not present

## 2022-10-01 HISTORY — PX: HARDWARE REMOVAL: SHX979

## 2022-10-01 SURGERY — REMOVAL, HARDWARE
Anesthesia: General | Site: Wrist | Laterality: Right

## 2022-10-01 MED ORDER — OXYCODONE HCL 5 MG PO TABS
10.0000 mg | ORAL_TABLET | Freq: Once | ORAL | Status: AC
  Administered 2022-10-01: 10 mg via ORAL

## 2022-10-01 MED ORDER — HYDROCODONE-ACETAMINOPHEN 5-325 MG PO TABS: 1.0000 | ORAL_TABLET | ORAL | 0 refills | Status: DC | PRN

## 2022-10-01 MED ORDER — FENTANYL CITRATE PF 50 MCG/ML IJ SOSY
50.0000 ug | PREFILLED_SYRINGE | INTRAMUSCULAR | Status: DC | PRN
  Administered 2022-10-01: 50 ug via INTRAVENOUS

## 2022-10-01 MED ORDER — FENTANYL CITRATE (PF) 100 MCG/2ML IJ SOLN
INTRAMUSCULAR | Status: DC | PRN
  Administered 2022-10-01: 100 ug via INTRAVENOUS

## 2022-10-01 MED ORDER — LACTATED RINGERS IV SOLN: INTRAVENOUS | Status: DC

## 2022-10-01 MED ORDER — PROPOFOL 10 MG/ML IV BOLUS
INTRAVENOUS | Status: DC | PRN
Start: 2022-10-01 — End: 2022-10-01
  Administered 2022-10-01: 200 mg via INTRAVENOUS

## 2022-10-01 MED ORDER — DEXAMETHASONE SODIUM PHOSPHATE 4 MG/ML IJ SOLN
INTRAMUSCULAR | Status: DC | PRN
  Administered 2022-10-01: 8 mg via INTRAVENOUS

## 2022-10-01 MED ORDER — 0.9 % SODIUM CHLORIDE (POUR BTL) OPTIME
TOPICAL | Status: DC | PRN
  Administered 2022-10-01: 500 mL

## 2022-10-01 MED ORDER — ONDANSETRON HCL 4 MG/2ML IJ SOLN
INTRAMUSCULAR | Status: DC | PRN
  Administered 2022-10-01: 4 mg via INTRAVENOUS

## 2022-10-01 MED ORDER — BUPIVACAINE HCL 0.5 % IJ SOLN
INTRAMUSCULAR | Status: DC | PRN
  Administered 2022-10-01: 10 mL

## 2022-10-01 MED ORDER — MIDAZOLAM HCL 5 MG/5ML IJ SOLN
INTRAMUSCULAR | Status: DC | PRN
  Administered 2022-10-01: 2 mg via INTRAVENOUS

## 2022-10-01 MED ORDER — CEFAZOLIN SODIUM-DEXTROSE 2-4 GM/100ML-% IV SOLN
2.0000 g | INTRAVENOUS | Status: AC
  Administered 2022-10-01: 2 g via INTRAVENOUS

## 2022-10-01 MED ORDER — HYDRALAZINE HCL 20 MG/ML IJ SOLN
2.0000 mg | Freq: Four times a day (QID) | INTRAMUSCULAR | Status: DC | PRN
  Administered 2022-10-01: 2 mg via INTRAVENOUS

## 2022-10-01 MED ORDER — LIDOCAINE HCL (CARDIAC) PF 100 MG/5ML IV SOSY
PREFILLED_SYRINGE | INTRAVENOUS | Status: DC | PRN
  Administered 2022-10-01: 100 mg via INTRATRACHEAL

## 2022-10-01 SURGICAL SUPPLY — 24 items
APL PRP STRL LF DISP 70% ISPRP (MISCELLANEOUS) ×1
BNDG CMPR STD VLCR NS LF 5.8X3 (GAUZE/BANDAGES/DRESSINGS)
BNDG CMPR STD VLCR NS LF 5.8X4 (GAUZE/BANDAGES/DRESSINGS)
BNDG ELASTIC 3X5.8 VLCR NS LF (GAUZE/BANDAGES/DRESSINGS) IMPLANT
BNDG ELASTIC 4X5.8 VLCR NS LF (GAUZE/BANDAGES/DRESSINGS) IMPLANT
CHLORAPREP W/TINT 26 (MISCELLANEOUS) ×1 IMPLANT
COVER LIGHT HANDLE UNIVERSAL (MISCELLANEOUS) ×2 IMPLANT
DRAPE FLUOR MINI C-ARM 54X84 (DRAPES) IMPLANT
ELECT REM PT RETURN 9FT ADLT (ELECTROSURGICAL) ×1
ELECTRODE REM PT RTRN 9FT ADLT (ELECTROSURGICAL) ×1 IMPLANT
GAUZE SPONGE 4X4 12PLY STRL (GAUZE/BANDAGES/DRESSINGS) ×1 IMPLANT
GAUZE XEROFORM 1X8 LF (GAUZE/BANDAGES/DRESSINGS) ×1 IMPLANT
GLOVE SURG SYN 9.0 PF PI (GLOVE) ×1 IMPLANT
GOWN STRL REIN 2XL XLG LVL4 (GOWN DISPOSABLE) ×1 IMPLANT
GOWN STRL REUS W/ TWL LRG LVL3 (GOWN DISPOSABLE) ×1 IMPLANT
GOWN STRL REUS W/TWL LRG LVL3 (GOWN DISPOSABLE) ×1
KIT TURNOVER KIT A (KITS) ×1 IMPLANT
NS IRRIG 500ML POUR BTL (IV SOLUTION) ×1 IMPLANT
PACK EXTREMITY ARMC (MISCELLANEOUS) ×1 IMPLANT
PADDING CAST BLEND 3X4 STRL (MISCELLANEOUS) IMPLANT
PADDING CAST BLEND 4X4 STRL (MISCELLANEOUS) IMPLANT
SUT ETHILON 4 0 FS (SUTURE) IMPLANT
SUT VIC AB 3-0 SH 27 (SUTURE) ×1
SUT VIC AB 3-0 SH 27X BRD (SUTURE) IMPLANT

## 2022-10-01 NOTE — Transfer of Care (Signed)
Immediate Anesthesia Transfer of Care Note  Patient: Austin Blankenship  Procedure(s) Performed: Right wrist dorsal plate removal (Right: Wrist)  Patient Location: PACU  Anesthesia Type: General  Level of Consciousness: awake, alert  and patient cooperative  Airway and Oxygen Therapy: Patient Spontanous Breathing and Patient connected to supplemental oxygen  Post-op Assessment: Post-op Vital signs reviewed, Patient's Cardiovascular Status Stable, Respiratory Function Stable, Patent Airway and No signs of Nausea or vomiting  Post-op Vital Signs: Reviewed and stable  Complications: No notable events documented.

## 2022-10-01 NOTE — Anesthesia Preprocedure Evaluation (Signed)
Anesthesia Evaluation  Patient identified by MRN, date of birth, ID band Patient awake    Reviewed: Allergy & Precautions, H&P , NPO status , Patient's Chart, lab work & pertinent test results  Airway Mallampati: III  TM Distance: >3 FB Neck ROM: Full    Dental no notable dental hx.    Pulmonary Current Smoker and Patient abstained from smoking.   Pulmonary exam normal breath sounds clear to auscultation       Cardiovascular negative cardio ROS Normal cardiovascular exam Rhythm:Regular Rate:Normal     Neuro/Psych negative neurological ROS  negative psych ROS   GI/Hepatic negative GI ROS, Neg liver ROS,,,  Endo/Other  negative endocrine ROS    Renal/GU negative Renal ROS  negative genitourinary   Musculoskeletal negative musculoskeletal ROS (+)    Abdominal   Peds negative pediatric ROS (+)  Hematology negative hematology ROS (+)   Anesthesia Other Findings   Reproductive/Obstetrics negative OB ROS                             Anesthesia Physical Anesthesia Plan  ASA: 1  Anesthesia Plan: General   Post-op Pain Management:    Induction: Intravenous  PONV Risk Score and Plan:   Airway Management Planned: LMA  Additional Equipment:   Intra-op Plan:   Post-operative Plan: Extubation in OR  Informed Consent: I have reviewed the patients History and Physical, chart, labs and discussed the procedure including the risks, benefits and alternatives for the proposed anesthesia with the patient or authorized representative who has indicated his/her understanding and acceptance.     Dental Advisory Given  Plan Discussed with: Anesthesiologist, CRNA and Surgeon  Anesthesia Plan Comments: (Patient consented for risks of anesthesia including but not limited to:  - adverse reactions to medications - damage to eyes, teeth, lips or other oral mucosa - nerve damage due to positioning  -  sore throat or hoarseness - Damage to heart, brain, nerves, lungs, other parts of body or loss of life  Patient voiced understanding.)       Anesthesia Quick Evaluation

## 2022-10-01 NOTE — Discharge Instructions (Signed)
Keep arm elevated the next few days to help minimize swelling Work on finger motion and wrist motion is much as you can Pain medicine as directed Keep dressing clean and dry until recheck Call office if you have any problems  [phone]

## 2022-10-01 NOTE — H&P (Signed)
     Chief Complaint Patient presents with Right Wrist - Pain, Tingling   History of the Present Illness: Austin Blankenship is a 37 y.o. male here today who is follow-up of an ORIF of the distal radius from 06/28/2022. He comes in today for an x-ray follow-up visit.  The patient reports stiffness in his wrist.  I have reviewed past medical, surgical, social and family history, and allergies as documented in the EMR.  Past Medical History: No past medical history on file.  Past Surgical History: Past Surgical History: Procedure Laterality Date OPEN REDUCTION INTERNAL FIXATION (ORIF) DISTAL RADIUS FRACTURE (Right) CARPAL TUNNEL RELEASE Right 06/28/2022 Dr. Rosita Kea FRACTURE SURGERY TONSILLECTOMY  Past Family History: Family History Problem Relation Age of Onset No Known Problems Mother No Known Problems Father  Medications: No current outpatient medications on file.  No current facility-administered medications for this visit.  Allergies: No Known Allergies  Body mass index is 26.5 kg/m.  Review of Systems: A comprehensive 14 point ROS was performed, reviewed, and the pertinent orthopaedic findings are documented in the HPI.  There were no vitals filed for this visit.  General Physical Examination:  General/Constitutional: No apparent distress: well-nourished and well developed. Eyes: Pupils equal, round with synchronous movement. Lungs: Clear to auscultation HEENT: Normal Vascular: No edema, swelling or tenderness, except as noted in detailed exam. Cardiac: Heart rate and rhythm is regular. Integumentary: No impressive skin lesions present, except as noted in detailed exam. Neuro/Psych: Normal mood and affect, oriented to person, place, and time.  Musculoskeletal Examination: Examination of the right wrist, supination from 50 to 80 degrees. Pronation is 90 degrees. Radial deviation is 25 degrees. Ulnar deviation is up to 20 degrees. Grip strength has improved.  Extension and flexion are approximately 50 degrees.  Radiographs:  AP, lateral, and oblique x-rays of the right wrist shows volar and dorsal plates with near anatomic reduction of the joint space. There is still radiolucent line from the fracture towards the radiocarpal joint, but otherwise joint surface looks fairly congruent. X-ray Impression: Nearly healed fracture with dorsal and volar plates.  Assessment: ICD-10-CM 1. Other closed intra-articular fracture of distal end of right radius with routine healing, subsequent encounter S52.571D 2. S/P ORIF (open reduction internal fixation) fracture Z98.890 Z87.81  Plan:  The patient is status post ORIF of the distal radius.  We discussed and reviewed his x-rays which show healing. It was explained that his plate may be causing discomfort and delay recovery. A dorsal plate removal of the right wrist is scheduled for the second half of 09/2022. I will provide a work note.  Document Attestation: Alessandra Grout, have reviewed and updated documentation for [facility], MD, utilizing [employer] DAX.   Electronically signed by Marlena Clipper, MD at 09/19/2022 8:16 AM EDT    Reviewed  H+P. No changes noted.      Reviewed  H+P. No changes noted.

## 2022-10-01 NOTE — Anesthesia Procedure Notes (Addendum)
Procedure Name: LMA Insertion Date/Time: 10/01/2022 1:05 PM  Performed by: Domenic Moras, CRNAPre-anesthesia Checklist: Patient identified, Emergency Drugs available, Suction available and Patient being monitored Patient Re-evaluated:Patient Re-evaluated prior to induction Oxygen Delivery Method: Circle system utilized Preoxygenation: Pre-oxygenation with 100% oxygen Induction Type: IV induction LMA: LMA inserted LMA Size: 4.0 Number of attempts: 1 Dental Injury: Teeth and Oropharynx as per pre-operative assessment

## 2022-10-01 NOTE — Op Note (Signed)
10/01/2022  1:37 PM  PATIENT:  Austin Blankenship  37 y.o. male  PRE-OPERATIVE DIAGNOSIS:  Other closed intra-articular fracture of distal end of right radius with routine healing, subsequent encounter S52.571D S/P ORIF fracture Z98.890, Z87.81  POST-OPERATIVE DIAGNOSIS:  Other closed intra-articular fracture of distal end of right radius with routine healing, subsequent encounter S/P ORIF fracture  PROCEDURE:  Procedure(s): Right wrist dorsal plate removal (Right)  SURGEON: Leitha Schuller, MD  ASSISTANTS: None  ANESTHESIA:   general  EBL:  Total I/O In: 400 [I.V.:400] Out: -   BLOOD ADMINISTERED:none  DRAINS: none   LOCAL MEDICATIONS USED:  MARCAINE    and Amount: 10 ml  SPECIMEN:  No Specimen  DISPOSITION OF SPECIMEN:  N/A  COUNTS:  YES  TOURNIQUET:   Total Tourniquet Time Documented: Upper Arm (Right) - 15 minutes Total: Upper Arm (Right) - 15 minutes   IMPLANTS: None  DICTATION: .Dragon Dictation patient brought the operating room and after adequate anesthesia was obtained the right arm was prepped and draped in usual sterile fashion with a tourniquet applied to the upper forearm.  After patient identification and timeout procedures were completed, tourniquet inflated.  The prior dorsal incision was opened and careful dissection carried out to not damage any extensor tendons.  The plate was then exposed without difficulty and the 2 most distal screws removed followed by the 2 middle screws and then the 1 proximal screw with plate removal after that without any difficulty.  The wound was thoroughly irrigated and tourniquet let down with hemostasis achieved electrocautery.  The wound was infiltrated with 10 cc of half percent Sensorcaine to aid in postop analgesia.  On range of motion there did appear to be there pronation and supination after plate removal better wrist flexion and extension seems similar.  The wound was then closed with 3-0 Vicryl subcutaneously followed  by 4-0 nylon for the skin in a simple interrupted fashion.  Xeroform 4 x 4 web roll and Ace wrap applied.  PLAN OF CARE: Discharge to home after PACU  PATIENT DISPOSITION:  PACU - hemodynamically stable.

## 2022-10-02 ENCOUNTER — Encounter: Payer: Self-pay | Admitting: Orthopedic Surgery

## 2022-10-02 NOTE — Anesthesia Postprocedure Evaluation (Signed)
Anesthesia Post Note  Patient: Austin Blankenship  Procedure(s) Performed: Right wrist dorsal plate removal (Right: Wrist)  Patient location during evaluation: PACU Anesthesia Type: General Level of consciousness: awake and alert Pain management: pain level controlled Vital Signs Assessment: post-procedure vital signs reviewed and stable Respiratory status: spontaneous breathing, nonlabored ventilation, respiratory function stable and patient connected to nasal cannula oxygen Cardiovascular status: blood pressure returned to baseline and stable Postop Assessment: no apparent nausea or vomiting Anesthetic complications: no   No notable events documented.   Last Vitals:  Vitals:   10/01/22 1421 10/01/22 1430  BP: (!) 145/97   Pulse: (!) 47 (!) 55  Resp: 13 17  Temp:  (!) 36.4 C  SpO2: 99% 100%    Last Pain:  Vitals:   10/02/22 1318  TempSrc:   PainSc: 0-No pain                 Juan Olthoff C Jourdyn Ferrin

## 2022-10-10 ENCOUNTER — Encounter: Payer: Self-pay | Admitting: Occupational Therapy

## 2022-10-10 ENCOUNTER — Ambulatory Visit: Payer: 59 | Admitting: Occupational Therapy

## 2022-10-10 DIAGNOSIS — M6281 Muscle weakness (generalized): Secondary | ICD-10-CM

## 2022-10-10 DIAGNOSIS — M25531 Pain in right wrist: Secondary | ICD-10-CM

## 2022-10-10 DIAGNOSIS — M25631 Stiffness of right wrist, not elsewhere classified: Secondary | ICD-10-CM

## 2022-10-10 DIAGNOSIS — L905 Scar conditions and fibrosis of skin: Secondary | ICD-10-CM

## 2022-10-10 DIAGNOSIS — M25641 Stiffness of right hand, not elsewhere classified: Secondary | ICD-10-CM

## 2022-10-10 NOTE — Therapy (Signed)
[facility] [address], [place], [address] Phone: [phone]   Fax:  [phone]  Occupational Therapy Treatment/Reassement  Patient Details  Name: Austin Blankenship MRN: [id number] Date of Birth: 24-Oct-1985 Referring Provider (OT): [place] PA   Encounter Date: 10/10/2022   OT End of Session - 10/10/22 2103     Visit Number 17    Number of Visits 29    Date for OT Re-Evaluation 12/05/22    OT Start Time 1203    OT Stop Time 1244    OT Time Calculation (min) 41 min    Activity Tolerance Patient tolerated treatment well    Behavior During Therapy [facility] for tasks assessed/performed             Past Medical History:  Diagnosis Date   Medical history non-contributory     Past Surgical History:  Procedure Laterality Date   CARPAL TUNNEL RELEASE Right 06/28/2022   Procedure: CARPAL TUNNEL RELEASE;  Surgeon: [name], MD;  Location: [facility];  Service: Orthopedics;  Laterality: Right;   FRACTURE SURGERY     left leg.  age 14 or 76   HARDWARE REMOVAL Right 10/01/2022   Procedure: Right wrist dorsal plate removal;  Surgeon: [name], MD;  Location: [facility];  Service: Orthopedics;  Laterality: Right;  EXPLANTED 1 VOLAR PLATE AND 5 PEGS FROM RIGHT WRIST BY DR. Rosita Kea.   OPEN REDUCTION INTERNAL FIXATION (ORIF) DISTAL RADIAL FRACTURE Right 06/28/2022   Procedure: OPEN REDUCTION INTERNAL FIXATION (ORIF) DISTAL RADIUS FRACTURE;  Surgeon: [name], MD;  Location: [facility];  Service: Orthopedics;  Laterality: Right;   TONSILLECTOMY     and Adenoids as child    There were no vitals filed for this visit.   Subjective Assessment - 10/10/22 1305     Subjective  The pain is better when I bend my wrist back- stitches coming out next week - feel stiff    Pertinent History 07/12/22 ORTHo NOTE- Austin Blankenship is a 37 y.o. male who presents today for suture removal status post a open  reduction internal fixation of right distal radius fracture in addition to a carpal tunnel release. The patient suffered a right distal radius fracture and underwent surgery with Dr. Rosita Kea on 06/28/2022. The patient had a complex distal radius fracture which did require use of both the volar and dorsal plate. The patient also suffered acute carpal tunnel at the time the injury and did have to undergo a carpal tunnel release. The patient was evaluated for skin check and was placed in a Velcro wrist splint, he was instructed to begin using his hand and flexing and extending his fingers. The patient presents today with still moderate swelling throughout the right hand, he does still report a burning in the median nerve distribution but does feel that the sensation is improving. He has been working on trying to make a full fist. He has been nonweightbearing to the right upper extremity. The patient is taking hydrocodone as needed for discomfort. Pain Blankenship today is a 7 out of 10. 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 No/denies                [facility] OT Assessment - 10/10/22 0001       AROM   Right Forearm Pronation 90 Degrees    Right Forearm Supination 75 Degrees    Right Wrist Extension 40 Degrees    Right Wrist Flexion 43 Degrees    Right Wrist Radial Deviation 25 Degrees    Right Wrist Ulnar Deviation 15 Degrees      Strength   Right Hand Grip (lbs) 32    Right Hand Lateral Pinch 18 lbs    Right Hand 3 Point Pinch 12 lbs    Left Hand Grip (lbs) 85    Left Hand Lateral Pinch 25 lbs    Left Hand 3 Point Pinch 23 lbs      Right Hand AROM   R Thumb Opposition to Index --   opposition to 2nd fold of  5th   R Index  MCP 0-90 80 Degrees    R Index PIP 0-100 80 Degrees    R Long  MCP 0-90 90 Degrees    R Long PIP 0-100 95 Degrees    R Ring  MCP 0-90 90 Degrees    R Ring PIP 0-100 100 Degrees    R Little  MCP 0-90 90 Degrees    R Little PIP 0-100 95 Degrees             Patient return after dorsal plate removal.  Patient with bandage in place.  Patient should decrease wrist and forearm active range of motion in all planes as well as increased stiffness. After contrast reviewed with patient active assisted range of motion in place and hold for supination as well as wrist extension over edge of table as well as radial ulnar deviation. Keeping with a slight pull less than 2/10. Patient can do contrast prior to exercises 2-3 times a day As well as passive range of motion for intrinsic deficit prior to composite fist.         OT Treatments/Exercises (OP) - 10/10/22 0001       RUE Contrast Bath   Time 8 minutes    Comments prior to ROM - decrease stiffness                    OT Education - 10/10/22 2102     Education Details progress and HEP changes    Person(s) Educated Patient    Methods Explanation;Demonstration;Tactile cues;Verbal cues;Handout    Comprehension Verbal cues required;Returned demonstration;Verbalized understanding              OT Short Term Goals - 10/10/22 2107       OT SHORT TERM GOAL #1   Title Patient to be independent in home program to decrease edema and increase motion for pain and to be able to lay fingers and wrist straight on table.    Status Achieved               OT Long Term Goals - 10/10/22 2107       OT LONG TERM GOAL #1   Title Digit flexion increased for patient to touch palm without increased symptoms to initiate using it in ADLs    Status Achieved      OT LONG TERM GOAL #2   Title Right wrist active range of motion increased to within functional limits for patient to use right hand in 50% of ADLs  Baseline Right wrist extension 5 degrees and flexion 30 degrees, ulnar deviation 5 degrees and radial deviation 12-not using the right hand or wrist activities7/15/24:  Pt using right hand and wrist in activities but still working on increasing active ROM NOW plate remove dorsally 10/01/22 decrease AROM in all planes for wrist and forearm    Time 6    Period Weeks    Status On-going    Target Date 11/21/22      OT LONG TERM GOAL #3   Title Right forearm supination pronation increased to within functional limits for patient to turn doorknob with minimal symptoms    Baseline Patient decreased greatly in supination 50 and pronation 70, 09/02/22:  continued improvement in sup/pro but still working on end ranges, sup 75, pro 90 degrees NOW sup 75 in session 85-    Time 4    Period Weeks    Status On-going    Target Date 11/07/22      OT LONG TERM GOAL #4   Title Right digit extension as well as thumb palmar radial abduction within functional limits to put hand in pocket and don gloves with no issues    Status Achieved      OT LONG TERM GOAL #5   Title Strength in right hand and wrist for patient to be able to carry 5 pounds with no increase symptoms, push and pull heavy door and initiate using tools    Baseline Pt had dorsal plate removed 0/98/11 bandage in place - stitches coming out 10/14/22 - some increase stiffness in right hand and wrist- decrease strength in right hand,  and wrist    Time 8    Period Weeks    Status On-going    Target Date 12/05/22                   Plan - 10/10/22 2104     Clinical Impression Statement Patient presented OT evaluation with diagnosis of s/p  right distal radius fracture with volar and dorsal plate, and carpal tunnel release by Dr. Rosita Kea 06/28/2022.  Pt about 3 1/2 months months s /p - Pt had dorsal plate removed 11/01/76 and return this date wtih v/o from Dr Rosita Kea to cont OT - to increase ROM - no restrictions. Bandage in place - stitches being remove  10/11/22. - pt with decrease AROM in wrist in all planes and some stiffness in intrinsic and composite fist.  Grip and prehension decrease. Pt limited in functional use of right hand and wrist in ADLs and IADLs..   Patient can benefit from skilled OT services to decrease scar tissue, and pain and increase motion and strength to return to prior level of function.    OT Occupational Profile and History Problem Focused Assessment - Including review of records relating to presenting problem    Occupational performance deficits (Please refer to evaluation for details): ADL's;IADL's;Work;Play;Social Participation;Leisure    Body Structure / Function / Physical Skills ADL;Decreased knowledge of precautions;Flexibility;ROM;UE functional use;Scar mobility;FMC;Dexterity;Sensation;Strength;Pain;IADL;Edema;Coordination    Rehab Potential Fair    Clinical Decision Making Limited treatment options, no task modification necessary    Comorbidities Affecting Occupational Performance: None    Modification or Assistance to Complete Evaluation  No modification of tasks or assist necessary to complete eval    OT Frequency 2x / week    OT Duration 8 weeks    OT Treatment/Interventions Self-care/ADL training;Therapeutic exercise;Patient/family education;Splinting;Paraffin;Fluidtherapy;Contrast Bath;DME and/or AE instruction;Manual Therapy;Passive range of motion;Scar mobilization;Therapeutic activities  Consulted and Agree with Plan of Care Patient             Patient will benefit from skilled therapeutic intervention in order to improve the following deficits and impairments:   Body Structure / Function / Physical Skills: ADL, Decreased knowledge of precautions, Flexibility, ROM, UE functional use, Scar mobility, FMC, Dexterity, Sensation, Strength, Pain, IADL, Edema, Coordination       Visit Diagnosis: Muscle weakness (generalized)  Stiffness of right hand, not elsewhere classified  Scar condition and  fibrosis of skin  Pain in right wrist  Stiffness of right wrist, not elsewhere classified    Problem List There are no problems to display for this patient.   Oletta Cohn, OTR/L,CLT 10/10/2022, 9:11 PM  [facility] [address], [place], [address] Phone: [phone]   Fax:  [phone]  Name: CLEMONS STEPLER MRN: [id number] Date of Birth: 10-May-1985

## 2022-10-14 ENCOUNTER — Ambulatory Visit: Payer: 59 | Admitting: Occupational Therapy

## 2022-10-14 DIAGNOSIS — M25641 Stiffness of right hand, not elsewhere classified: Secondary | ICD-10-CM

## 2022-10-14 DIAGNOSIS — M6281 Muscle weakness (generalized): Secondary | ICD-10-CM | POA: Diagnosis not present

## 2022-10-14 DIAGNOSIS — R600 Localized edema: Secondary | ICD-10-CM

## 2022-10-14 DIAGNOSIS — M25531 Pain in right wrist: Secondary | ICD-10-CM

## 2022-10-14 DIAGNOSIS — M79644 Pain in right finger(s): Secondary | ICD-10-CM

## 2022-10-14 DIAGNOSIS — L905 Scar conditions and fibrosis of skin: Secondary | ICD-10-CM

## 2022-10-14 DIAGNOSIS — M25631 Stiffness of right wrist, not elsewhere classified: Secondary | ICD-10-CM

## 2022-10-17 ENCOUNTER — Ambulatory Visit: Payer: 59 | Admitting: Occupational Therapy

## 2022-10-17 ENCOUNTER — Encounter: Payer: Self-pay | Admitting: Occupational Therapy

## 2022-10-17 DIAGNOSIS — R600 Localized edema: Secondary | ICD-10-CM

## 2022-10-17 DIAGNOSIS — M25641 Stiffness of right hand, not elsewhere classified: Secondary | ICD-10-CM

## 2022-10-17 DIAGNOSIS — M25631 Stiffness of right wrist, not elsewhere classified: Secondary | ICD-10-CM

## 2022-10-17 DIAGNOSIS — M6281 Muscle weakness (generalized): Secondary | ICD-10-CM | POA: Diagnosis not present

## 2022-10-17 DIAGNOSIS — L905 Scar conditions and fibrosis of skin: Secondary | ICD-10-CM

## 2022-10-17 DIAGNOSIS — M79644 Pain in right finger(s): Secondary | ICD-10-CM

## 2022-10-17 DIAGNOSIS — M25531 Pain in right wrist: Secondary | ICD-10-CM

## 2022-10-17 NOTE — Therapy (Signed)
[facility] [address] [place], [address] Phone: [phone]   Fax:  [phone]  Occupational Therapy Treatment  Patient Details  Name: Austin Blankenship MRN: [id number] Date of Birth: 1985/11/06 Referring Provider (OT): [place] PA   Encounter Date: 10/14/2022   OT End of Session - 10/17/22 2209     Visit Number 18    Number of Visits 29    Date for OT Re-Evaluation 12/05/22    OT Start Time 1200    OT Stop Time 1245    OT Time Calculation (min) 45 min    Activity Tolerance Patient tolerated treatment well    Behavior During Therapy [facility] for tasks assessed/performed             Past Medical History:  Diagnosis Date   Medical history non-contributory     Past Surgical History:  Procedure Laterality Date   CARPAL TUNNEL RELEASE Right 06/28/2022   Procedure: CARPAL TUNNEL RELEASE;  Surgeon: [name], MD;  Location: [facility];  Service: Orthopedics;  Laterality: Right;   FRACTURE SURGERY     left leg.  age 73 or 62   HARDWARE REMOVAL Right 10/01/2022   Procedure: Right wrist dorsal plate removal;  Surgeon: [name], MD;  Location: [facility];  Service: Orthopedics;  Laterality: Right;  EXPLANTED 1 VOLAR PLATE AND 5 PEGS FROM RIGHT WRIST BY DR. Rosita Blankenship.   OPEN REDUCTION INTERNAL FIXATION (ORIF) DISTAL RADIAL FRACTURE Right 06/28/2022   Procedure: OPEN REDUCTION INTERNAL FIXATION (ORIF) DISTAL RADIUS FRACTURE;  Surgeon: [name], MD;  Location: [facility];  Service: Orthopedics;  Laterality: Right;   TONSILLECTOMY     and Adenoids as child    There were no vitals filed for this visit.   Subjective Assessment - 10/17/22 2208     Subjective  Pt reports he will be going to the doctor after therapy to get his stitches out.    Pertinent History 07/12/22 ORTHo NOTE- Austin Blankenship is a 37 y.o. male who presents today for suture removal status post a open reduction internal  fixation of right distal radius fracture in addition to a carpal tunnel release. The patient suffered a right distal radius fracture and underwent surgery with Dr. Rosita Blankenship on 06/28/2022. The patient had a complex distal radius fracture which did require use of both the volar and dorsal plate. The patient also suffered acute carpal tunnel at the time the injury and did have to undergo a carpal tunnel release. The patient was evaluated for skin check and was placed in a Velcro wrist splint, he was instructed to begin using his hand and flexing and extending his fingers. The patient presents today with still moderate swelling throughout the right hand, he does still report a burning in the median nerve distribution but does feel that the sensation is improving. He has been working on trying to make a full fist. He has been nonweightbearing to the right upper extremity. The patient is taking hydrocodone as needed for discomfort. Pain score today is a 7 out of 10. 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     Contrast:  Pt seen for use of contrast to wrist and hand to decrease edema and increase tissue mobility and ROM, decrease pain.   Manual Therapy:   Following contrast, pt seen for carpal and metacarpal spreads to promote lymphatic flow and open pathways to decrease edema in hand and wrist. No scar massage yet since pt has stitches  Therapeutic Exercise:    Active assisted range of motion with place and hold for supination as well as wrist extension over edge of table as well as  radial/ulnar deviation. Keeping with a slight pull less than 2/10. Tendon gliding exercises with cues for form/technique Wrist AAROM for flexion/extension, RD/UD and supin/pro     OT Education - 10/17/22 2208     Education Details progress and HEP changes    Person(s) Educated Patient    Methods Explanation;Demonstration;Tactile cues;Verbal cues;Handout    Comprehension Verbal cues required;Returned demonstration;Verbalized understanding              OT Short Term Goals - 10/10/22 2107       OT SHORT TERM GOAL #1   Title Patient to be independent in home program to decrease edema and increase motion for pain and to be able to lay fingers and wrist straight on table.    Status Achieved               OT Long Term Goals - 10/10/22 2107       OT LONG TERM GOAL #1   Title Digit flexion increased for patient to touch palm without increased symptoms to initiate using it in ADLs    Status Achieved      OT LONG TERM GOAL #2   Title Right wrist active range of motion increased to within functional limits for patient to use right hand in 50% of ADLs    Baseline Right wrist extension 5 degrees and flexion 30 degrees, ulnar deviation 5 degrees and radial deviation 12-not using the right hand or wrist activities7/15/24:  Pt using right hand and wrist in activities but still working on increasing active ROM NOW plate remove dorsally 10/01/22 decrease AROM in all planes for wrist and forearm    Time 6    Period Weeks    Status On-going    Target Date 11/21/22      OT LONG TERM GOAL #3   Title Right forearm supination pronation increased to within functional limits for patient to turn doorknob with minimal symptoms    Baseline Patient decreased greatly in supination 50 and pronation 70, 09/02/22:  continued improvement in sup/pro but still working on end ranges, sup 75, pro 90 degrees NOW sup 75 in session 85-    Time 4    Period Weeks    Status On-going    Target Date 11/07/22       OT LONG TERM GOAL #4   Title Right digit extension as well as thumb palmar radial abduction within functional limits to put hand in pocket and don gloves with no issues    Status Achieved      OT LONG TERM GOAL #5   Title Strength in right hand and wrist for patient to be able to carry 5 pounds with no increase symptoms, push and pull heavy door and initiate using tools    Baseline Pt had dorsal plate removed 2/53/66  bandage in place - stitches coming out 10/14/22 - some increase stiffness in right hand and wrist- decrease strength in right hand,  and wrist    Time 8    Period Weeks    Status On-going    Target Date 12/05/22                   Plan - 10/17/22 2209     Clinical Impression Statement Patient presented OT evaluation with diagnosis of s/p  right distal radius fracture with volar and dorsal plate, and carpal tunnel release by Dr. Rosita Blankenship 06/28/2022.  Pt about 3 1/2 months s /p - Pt had dorsal plate removed 1/61/09 and return this date wtih v/o from Dr Austin Blankenship to cont OT - to increase ROM - no restrictions. Bandage in place - stitches being remove 10/11/22.  Pt continues to demonstrate decreased AROM in wrist in all planes and some stiffness in intrinsic and composite fist but reports he no longer feels a block with wrist movement.  Grip and prehension remains decreased.  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   Pt limited in functional use of right hand and wrist in ADLs and IADLs. Patient can benefit from skilled OT services to decrease scar tissue, and pain and increase motion and strength to return to prior level of function.    OT Occupational Profile and History Problem Focused Assessment - Including review of records relating to presenting problem    Occupational performance deficits (Please refer to evaluation for details): ADL's;IADL's;Work;Play;Social Participation;Leisure    Body Structure / Function / Physical  Skills ADL;Decreased knowledge of precautions;Flexibility;ROM;UE functional use;Scar mobility;FMC;Dexterity;Sensation;Strength;Pain;IADL;Edema;Coordination    Rehab Potential Fair    Clinical Decision Making Limited treatment options, no task modification necessary    Comorbidities Affecting Occupational Performance: None    Modification or Assistance to Complete Evaluation  No modification of tasks or assist necessary to complete eval    OT Frequency 2x / week    OT Duration 8 weeks    OT Treatment/Interventions Self-care/ADL training;Therapeutic exercise;Patient/family education;Splinting;Paraffin;Fluidtherapy;Contrast Bath;DME and/or AE instruction;Manual Therapy;Passive range of motion;Scar mobilization;Therapeutic activities    Consulted and Agree with Plan of Care Patient             Patient will benefit from skilled therapeutic intervention in order to improve the following deficits and impairments:   Body Structure / Function / Physical Skills: ADL, Decreased knowledge of precautions, Flexibility, ROM, UE functional use, Scar mobility, FMC, Dexterity, Sensation, Strength, Pain, IADL, Edema, Coordination       Visit Diagnosis: Muscle weakness (generalized)  Stiffness of right hand, not elsewhere classified  Scar condition and fibrosis of skin  Pain in right wrist  Stiffness of right wrist, not elsewhere classified  Pain in right finger(s)  Localized edema    Problem List There are no problems to display for this patient.  Daleah Coulson Cornelius Moras, OTR/L, CLT  Isatu Macinnes, OT 10/18/2022, 5:20 PM  [facility] [address], [place], [address] Phone: [phone]   Fax:  [phone]  Name: Austin Blankenship MRN: [id number] Date of Birth: 09/22/1985

## 2022-10-18 ENCOUNTER — Encounter: Payer: Self-pay | Admitting: Occupational Therapy

## 2022-10-18 NOTE — Therapy (Unsigned)
[facility] [address], [place], [address] Phone: [phone]   Fax:  [phone]  Occupational Therapy Treatment  Patient Details  Name: Austin Blankenship MRN: [id number] Date of Birth: 1985-08-31 Referring Provider (OT): [place] PA   Encounter Date: 10/17/2022   OT End of Session - 10/18/22 2145     Visit Number 19    Number of Visits 29    Date for OT Re-Evaluation 12/05/22    OT Start Time 1400    OT Stop Time 1445    OT Time Calculation (min) 45 min    Activity Tolerance Patient tolerated treatment well    Behavior During Therapy [facility] for tasks assessed/performed             Past Medical History:  Diagnosis Date   Medical history non-contributory     Past Surgical History:  Procedure Laterality Date   CARPAL TUNNEL RELEASE Right 06/28/2022   Procedure: CARPAL TUNNEL RELEASE;  Surgeon: [name], MD;  Location: [facility];  Service: Orthopedics;  Laterality: Right;   FRACTURE SURGERY     left leg.  age 55 or 22   HARDWARE REMOVAL Right 10/01/2022   Procedure: Right wrist dorsal plate removal;  Surgeon: [name], MD;  Location: [facility];  Service: Orthopedics;  Laterality: Right;  EXPLANTED 1 VOLAR PLATE AND 5 PEGS FROM RIGHT WRIST BY DR. Rosita Kea.   OPEN REDUCTION INTERNAL FIXATION (ORIF) DISTAL RADIAL FRACTURE Right 06/28/2022   Procedure: OPEN REDUCTION INTERNAL FIXATION (ORIF) DISTAL RADIUS FRACTURE;  Surgeon: [name], MD;  Location: [facility];  Service: Orthopedics;  Laterality: Right;   TONSILLECTOMY     and Adenoids as child    There were no vitals filed for this visit.   Subjective Assessment - 10/18/22 2147     Subjective  Pt reports he went to the doctor the other day and got his stitches out, has steri strips now.    Pertinent History 07/12/22 ORTHo NOTE- Austin Blankenship is a 37 y.o. male who presents today for suture removal status post a open  reduction internal fixation of right distal radius fracture in addition to a carpal tunnel release. The patient suffered a right distal radius fracture and underwent surgery with Dr. Rosita Kea on 06/28/2022. The patient had a complex distal radius fracture which did require use of both the volar and dorsal plate. The patient also suffered acute carpal tunnel at the time the injury and did have to undergo a carpal tunnel release. The patient was evaluated for skin check and was placed in a Velcro wrist splint, he was instructed to begin using his hand and flexing and extending his fingers. The patient presents today with still moderate swelling throughout the right hand, he does still report a burning in the median nerve distribution but does feel that the sensation is improving. He has been working on trying to make a full fist. He has been nonweightbearing to the right upper extremity. The patient is taking hydrocodone as needed for discomfort. Pain score today is a 7 out of 10. 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  Cleaned area well with alcohol swab and replaced strips.     Contrast:  Pt seen for use of contrast to wrist and hand to decrease edema and increase tissue mobility and ROM, decrease pain, 11 mins alternating warm and cold.  Pt to perform at home 2-3 times a day.     Manual Therapy:   Following contrast, pt seen for carpal and metacarpal spreads to promote lymphatic flow and open pathways to decrease edema in hand and wrist. Scar massage along the edges of the scar line as scar heals.  Will plan to issue [employer] next week as steri  strips fall away.    Therapeutic Exercise:    Active assisted range of motion with place and hold for supination as well as wrist extension over edge of table as well as radial/ulnar deviation. Keeping with a slight pull less than 2/10. Tendon gliding exercises with cues for form/technique Wrist AAROM for flexion/extension, RD/UD and supin/pro Continued focus on ROM exercises in all directions AAROM/passive stretches on tabletop for wrist flexion/extension for 2 sets of 12 reps each UD/RD tabletop stretch Table slides in standing 1# weight for wrist flexion/extension, UD/RD, sup/pronation for 12 reps for 1 set each  Continue with contrast at home to target edema, 2-3 times a day Pt to start with scar massage gently over steri strips and along scar line as part of home program, Continue with ROM exercises to focus on active motion    OT Education - 10/18/22 2147     Education Details progress and HEP changes    Person(s) Educated Patient    Methods Explanation;Demonstration;Tactile cues;Verbal cues;Handout    Comprehension Verbal cues required;Returned demonstration;Verbalized understanding              OT Short Term Goals - 10/10/22 2107       OT SHORT TERM GOAL #1   Title Patient to be independent in home program to decrease edema and increase motion for pain and to be able to lay fingers and wrist straight on table.    Status Achieved               OT Long Term Goals - 10/10/22 2107       OT LONG TERM GOAL #1   Title Digit flexion increased for patient to touch palm without increased symptoms to initiate using it in ADLs    Status Achieved      OT LONG TERM GOAL #2   Title Right wrist active range of motion increased to within functional limits for patient to use right hand in 50% of ADLs    Baseline Right wrist extension 5 degrees and flexion 30 degrees, ulnar deviation 5 degrees and radial deviation 12-not using the right hand or wrist activities7/15/24:  Pt  using right hand and wrist in activities but still working on increasing active ROM NOW plate remove dorsally 10/01/22 decrease AROM in all planes for wrist and forearm    Time 6    Period Weeks    Status On-going    Target Date 11/21/22      OT LONG TERM GOAL #3   Title Right forearm supination pronation increased to within functional limits for patient to turn doorknob with minimal symptoms    Baseline Patient decreased greatly in supination 50 and pronation 70, 09/02/22:  continued improvement in sup/pro but still working on end ranges, sup 75, pro 90 degrees NOW  sup 75 in session 85-    Time 4    Period Weeks    Status On-going    Target Date 11/07/22      OT LONG TERM GOAL #4   Title Right digit extension as well as thumb palmar radial abduction within functional limits to put hand in pocket and don gloves with no issues    Status Achieved      OT LONG TERM GOAL #5   Title Strength in right hand and wrist for patient to be able to carry 5 pounds with no increase symptoms, push and pull heavy door and initiate using tools    Baseline Pt had dorsal plate removed 1/61/09 bandage in place - stitches coming out 10/14/22 - some increase stiffness in right hand and wrist- decrease strength in right hand,  and wrist    Time 8    Period Weeks    Status On-going    Target Date 12/05/22                   Plan - 10/18/22 2147     Clinical Impression Statement Patient presented OT evaluation with diagnosis of s/p  right distal radius fracture with volar and dorsal plate, and carpal tunnel release by Dr. Rosita Kea 06/28/2022.  Pt about 3 1/2 months s /p - Pt had dorsal plate removed 07/22/52 and return this date wtih v/o from Dr Rosita Kea to cont OT - to increase ROM - no restrictions.  Pt continues to demonstrate decreased AROM in wrist in all planes and some stiffness in intrinsic and composite fist but reports he no longer feels a block with wrist movement.  Grip and prehension remains decreased.  Pt now with stitches removed and has steri strips in place.  Will begin scar massage gently over steri strips this week.  Continue with contrast and focus on active motion of wrist in all planes, composite and instrinsic fisting.    Pt limited in functional use of right hand and wrist in ADLs and IADLs. Patient can benefit from skilled OT services to decrease scar tissue, and pain and increase motion and strength to return to prior level of function.    OT Occupational Profile and History Problem Focused Assessment - Including review of records relating to presenting problem    Occupational performance deficits (Please refer to evaluation for details): ADL's;IADL's;Work;Play;Social Participation;Leisure    Body Structure / Function / Physical Skills ADL;Decreased knowledge of precautions;Flexibility;ROM;UE functional use;Scar mobility;FMC;Dexterity;Sensation;Strength;Pain;IADL;Edema;Coordination    Rehab Potential Fair    Clinical Decision Making Limited treatment options, no task modification necessary    Comorbidities Affecting Occupational Performance: None    Modification or Assistance to Complete Evaluation  No modification of tasks or assist necessary to complete eval    OT Frequency 2x / week    OT Duration 8 weeks    OT Treatment/Interventions Self-care/ADL training;Therapeutic exercise;Patient/family education;Splinting;Paraffin;Fluidtherapy;Contrast Bath;DME and/or AE instruction;Manual Therapy;Passive range of motion;Scar mobilization;Therapeutic activities    Consulted and Agree with Plan of Care Patient             Patient will benefit from skilled therapeutic intervention in order to improve the following deficits and impairments:   Body Structure / Function / Physical Skills: ADL, Decreased knowledge of precautions, Flexibility, ROM, UE functional use, Scar mobility, FMC, Dexterity, Sensation, Strength, Pain, IADL, Edema, Coordination       Visit Diagnosis: Muscle weakness  (generalized)  Stiffness of right hand, not elsewhere classified  Scar condition and fibrosis of  skin  Pain in right wrist  Stiffness of right wrist, not elsewhere classified  Pain in right finger(s)  Localized edema    Problem List There are no problems to display for this patient.  Murad Staples Cornelius Moras, OTR/L, CLT  Navea Woodrow, OT 10/19/2022, 7:40 PM  [place] [facility] [address], [place], [address] Phone: [phone]   Fax:  [phone]  Name: SHAUNDELL LEDFORD MRN: [id number] Date of Birth: 12-25-1985

## 2022-10-22 ENCOUNTER — Ambulatory Visit: Payer: 59 | Attending: Student | Admitting: Occupational Therapy

## 2022-10-22 DIAGNOSIS — R600 Localized edema: Secondary | ICD-10-CM | POA: Insufficient documentation

## 2022-10-22 DIAGNOSIS — M6281 Muscle weakness (generalized): Secondary | ICD-10-CM | POA: Diagnosis present

## 2022-10-22 DIAGNOSIS — L905 Scar conditions and fibrosis of skin: Secondary | ICD-10-CM | POA: Diagnosis present

## 2022-10-22 DIAGNOSIS — M25631 Stiffness of right wrist, not elsewhere classified: Secondary | ICD-10-CM | POA: Insufficient documentation

## 2022-10-22 DIAGNOSIS — M25531 Pain in right wrist: Secondary | ICD-10-CM | POA: Insufficient documentation

## 2022-10-22 DIAGNOSIS — M25641 Stiffness of right hand, not elsewhere classified: Secondary | ICD-10-CM | POA: Insufficient documentation

## 2022-10-22 DIAGNOSIS — M79644 Pain in right finger(s): Secondary | ICD-10-CM | POA: Insufficient documentation

## 2022-10-24 ENCOUNTER — Encounter: Payer: Self-pay | Admitting: Occupational Therapy

## 2022-10-24 ENCOUNTER — Ambulatory Visit: Payer: 59 | Admitting: Occupational Therapy

## 2022-10-24 DIAGNOSIS — M25631 Stiffness of right wrist, not elsewhere classified: Secondary | ICD-10-CM

## 2022-10-24 DIAGNOSIS — M6281 Muscle weakness (generalized): Secondary | ICD-10-CM

## 2022-10-24 DIAGNOSIS — L905 Scar conditions and fibrosis of skin: Secondary | ICD-10-CM

## 2022-10-24 DIAGNOSIS — M25641 Stiffness of right hand, not elsewhere classified: Secondary | ICD-10-CM

## 2022-10-24 NOTE — Therapy (Signed)
[facility] [address], [place], [address] Phone: [phone]   Fax:  [phone]  Occupational Therapy Treatment/Progress Update Reporting period from 09/02/2022 to 10/22/2022  Patient Details  Name: Austin Blankenship MRN: [id number] Date of Birth: May 10, 1985 Referring Provider (OT): [place]   Encounter Date: 10/22/2022   OT End of Session - 10/24/22 1003     Visit Number 20    Number of Visits 29    Date for OT Re-Evaluation 12/05/22    OT Start Time 1200    OT Stop Time 1250    OT Time Calculation (min) 50 min    Activity Tolerance Patient tolerated treatment well    Behavior During Therapy [facility] for tasks assessed/performed             Past Medical History:  Diagnosis Date   Medical history non-contributory     Past Surgical History:  Procedure Laterality Date   CARPAL TUNNEL RELEASE Right 06/28/2022   Procedure: CARPAL TUNNEL RELEASE;  Surgeon: [name], MD;  Location: [facility];  Service: Orthopedics;  Laterality: Right;   FRACTURE SURGERY     left leg.  age 14 or 55   HARDWARE REMOVAL Right 10/01/2022   Procedure: Right wrist dorsal plate removal;  Surgeon: [name], MD;  Location: [facility];  Service: Orthopedics;  Laterality: Right;  EXPLANTED 1 VOLAR PLATE AND 5 PEGS FROM RIGHT WRIST BY DR. Rosita Kea.   OPEN REDUCTION INTERNAL FIXATION (ORIF) DISTAL RADIAL FRACTURE Right 06/28/2022   Procedure: OPEN REDUCTION INTERNAL FIXATION (ORIF) DISTAL RADIUS FRACTURE;  Surgeon: [name], MD;  Location: [facility];  Service: Orthopedics;  Laterality: Right;   TONSILLECTOMY     and Adenoids as child    There were no vitals filed for this visit.   Subjective Assessment - 10/24/22 1002     Subjective  Pt reports he is doing well, his friend came with him today.    Pertinent History 07/12/22 ORTHo NOTE- Austin Blankenship is a 37 y.o. male who presents today for suture  removal status post a open reduction internal fixation of right distal radius fracture in addition to a carpal tunnel release. The patient suffered a right distal radius fracture and underwent surgery with Dr. Rosita Kea on 06/28/2022. The patient had a complex distal radius fracture which did require use of both the volar and dorsal plate. The patient also suffered acute carpal tunnel at the time the injury and did have to undergo a carpal tunnel release. The patient was evaluated for skin check and was placed in a Velcro wrist splint, he was instructed to begin using his hand and flexing and extending his fingers. The patient presents today with still moderate swelling throughout the right hand, he does still report a burning in the median nerve distribution but does feel that the sensation is improving. He has been working on trying to make a full fist. He has been nonweightbearing to the right upper extremity. The patient is taking hydrocodone as needed for discomfort. Pain score today is a 7 out of 10. 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 No/denies    Pain Score 0-No pain            Pt reports he is doing well, no pain, just stiffness and can feel some "pulling" sensations at times in hand and wrist.  Cleaned area well with alcohol swab and replaced strips in one small area.      Contrast:  Pt seen for use of contrast to wrist and hand to decrease edema and increase tissue mobility and ROM, decrease pain, 8 mins alternating warm and cold.  Pt to continue to perform at home 2-3 times a day to help with managing edema.     Manual Therapy:   Following contrast, pt seen for carpal and metacarpal spreads to  promote lymphatic flow and open pathways to decrease edema in hand and wrist. Scar massage performed this date to decrease adhesions, increase tissue mobility and promote scar healing.  Will plan to issue Cica Care next week as steri strips fall away.    Therapeutic Exercise:    Active assisted range of motion with place and hold for supination as well as wrist extension over edge of table as well as radial/ulnar deviation. Keeping with a slight pull less than 2/10. Tendon gliding exercises with cues for form/technique Wrist AAROM for flexion/extension, RD/UD and supin/pro Continued focus on ROM exercises in all directions AAROM/passive stretches on tabletop for wrist flexion/extension for 2 sets of 12 reps each UD/RD tabletop stretch Table slides in standing 1# weight for wrist flexion/extension, UD/RD, sup/pronation for 12 reps for 1 set each  Added CPM for wrist extension this date for 2 sets of 200 sec each. Up to 45 degrees and  to 48 degrees for sec set.   Continue with contrast at home to target edema, 2-3 times a day Scar massage Continue with ROM exercises to focus on active motion    OT Education - 10/24/22 1003     Education Details progress and HEP changes    Person(s) Educated Patient    Methods Explanation;Demonstration;Tactile cues;Verbal cues;Handout    Comprehension Verbal cues required;Returned demonstration;Verbalized understanding              OT Short Term Goals - 10/24/22 1033       OT SHORT TERM GOAL #1   Title Patient to be independent in home program to decrease edema and increase motion for pain and to be able to lay fingers and wrist straight on table.    Status Achieved               OT Long Term Goals - 10/24/22 1033       OT LONG TERM GOAL #1   Title Digit flexion increased for patient to touch palm without increased symptoms to initiate using it in ADLs    Status Achieved      OT LONG TERM GOAL #2   Title Right wrist active range of  motion increased to within functional limits for patient to use right hand in 50% of ADLs    Baseline Right wrist extension 5 degrees and flexion 30 degrees, ulnar deviation 5 degrees and radial deviation 12-not using the right hand or wrist activities7/15/24:  Pt using right hand and wrist in activities but still working on increasing active ROM NOW plate remove dorsally 10/01/22 decrease AROM in all planes for wrist and forearm.  10/22/22:Right wrist extension 38 degrees, flex 50    Time 6    Period Weeks    Status On-going  Target Date 11/21/22      OT LONG TERM GOAL #3   Title Right forearm supination pronation increased to within functional limits for patient to turn doorknob with minimal symptoms    Baseline Patient decreased greatly in supination 50 and pronation 70, 09/02/22:  continued improvement in sup/pro but still working on end ranges, sup 75, pro 90 degrees NOW sup 75 in session 85-.  10/22/22:  80 degrees supination    Time 4    Period Weeks    Status On-going    Target Date 11/07/22      OT LONG TERM GOAL #4   Title Right digit extension as well as thumb palmar radial abduction within functional limits to put hand in pocket and don gloves with no issues    Status Achieved      OT LONG TERM GOAL #5   Title Strength in right hand and wrist for patient to be able to carry 5 pounds with no increase symptoms, push and pull heavy door and initiate using tools    Baseline Pt had dorsal plate removed 05/28/79 bandage in place - stitches coming out 10/14/22 - some increase stiffness in right hand and wrist- decrease strength in right hand,  and wrist.  10/22/22:  continues to work on stiffness and functional use    Time 8    Period Weeks    Status On-going    Target Date 12/05/22                   Plan - 10/24/22 1004     Clinical Impression Statement Patient presented OT evaluation with diagnosis of s/p  right distal radius fracture with volar and dorsal plate, and carpal  tunnel release by Dr. Rosita Kea 06/28/2022.  Pt about 3 1/2 months s /p - Pt had dorsal plate removed 1/91/47 and return this date wtih v/o from Dr Rosita Kea to cont OT - to increase ROM - no restrictions.  Pt continues to demonstrate decreased AROM in wrist in all planes and some stiffness in intrinsic and composite fist but reports he no longer feels a block with wrist movement.  Grip and prehension remains decreased. Pt now with stitches removed and has steri strips in place, one strip remaining. Continue with contrast and focus on active motion of wrist in all planes, composite and instrinsic fisting. Scar massage this week, added CPM again, table slides, focus on motion.   Pt limited in functional use of right hand and wrist in ADLs and IADLs. Patient can benefit from skilled OT services to decrease scar tissue, and pain and increase motion and strength to return to prior level of function.    OT Occupational Profile and History Problem Focused Assessment - Including review of records relating to presenting problem    Occupational performance deficits (Please refer to evaluation for details): ADL's;IADL's;Work;Play;Social Participation;Leisure    Body Structure / Function / Physical Skills ADL;Decreased knowledge of precautions;Flexibility;ROM;UE functional use;Scar mobility;FMC;Dexterity;Sensation;Strength;Pain;IADL;Edema;Coordination    Rehab Potential Fair    Clinical Decision Making Limited treatment options, no task modification necessary    Comorbidities Affecting Occupational Performance: None    Modification or Assistance to Complete Evaluation  No modification of tasks or assist necessary to complete eval    OT Frequency 2x / week    OT Duration 8 weeks    OT Treatment/Interventions Self-care/ADL training;Therapeutic exercise;Patient/family education;Splinting;Paraffin;Fluidtherapy;Contrast Bath;DME and/or AE instruction;Manual Therapy;Passive range of motion;Scar mobilization;Therapeutic activities     Consulted and Agree with Plan of Care  Patient             Patient will benefit from skilled therapeutic intervention in order to improve the following deficits and impairments:   Body Structure / Function / Physical Skills: ADL, Decreased knowledge of precautions, Flexibility, ROM, UE functional use, Scar mobility, FMC, Dexterity, Sensation, Strength, Pain, IADL, Edema, Coordination       Visit Diagnosis: Muscle weakness (generalized)  Stiffness of right hand, not elsewhere classified  Scar condition and fibrosis of skin  Pain in right wrist  Stiffness of right wrist, not elsewhere classified  Pain in right finger(s)  Localized edema    Problem List There are no problems to display for this patient. Kendry Pfarr Cornelius Moras, OTR/L, CLT Elaiza Shoberg, OT 10/24/2022, 10:37 AM  [facility] [address] [place], [address] Phone: [phone]   Fax:  [phone]  Name: WILLIIAM DUNSTER MRN: [id number] Date of Birth: 1985/09/08

## 2022-10-24 NOTE — Therapy (Signed)
[facility] [address] [place], [address] Phone: [phone]   Fax:  [phone]  Occupational Therapy Treatment  Patient Details  Name: Austin Blankenship MRN: [id number] Date of Birth: 14-May-1985 Referring Provider (OT): [place] PA   Encounter Date: 10/24/2022   OT End of Session - 10/24/22 1940     Visit Number 21    Number of Visits 29    Date for OT Re-Evaluation 12/05/22    OT Start Time 1205    OT Stop Time 1255    OT Time Calculation (min) 50 min    Activity Tolerance Patient tolerated treatment well    Behavior During Therapy [facility] for tasks assessed/performed             Past Medical History:  Diagnosis Date   Medical history non-contributory     Past Surgical History:  Procedure Laterality Date   CARPAL TUNNEL RELEASE Right 06/28/2022   Procedure: CARPAL TUNNEL RELEASE;  Surgeon: [name], MD;  Location: [facility];  Service: Orthopedics;  Laterality: Right;   FRACTURE SURGERY     left leg.  age 76 or 63   HARDWARE REMOVAL Right 10/01/2022   Procedure: Right wrist dorsal plate removal;  Surgeon: [name], MD;  Location: [facility];  Service: Orthopedics;  Laterality: Right;  EXPLANTED 1 VOLAR PLATE AND 5 PEGS FROM RIGHT WRIST BY DR. Rosita Kea.   OPEN REDUCTION INTERNAL FIXATION (ORIF) DISTAL RADIAL FRACTURE Right 06/28/2022   Procedure: OPEN REDUCTION INTERNAL FIXATION (ORIF) DISTAL RADIUS FRACTURE;  Surgeon: [name], MD;  Location: [facility];  Service: Orthopedics;  Laterality: Right;   TONSILLECTOMY     and Adenoids as child    There were no vitals filed for this visit.   Subjective Assessment - 10/24/22 1938     Subjective  I still have this little area where it looked like there was a stitch in it.  The Steri-Strips on.  Still feels tight at the wrist and scar.    Pertinent History 07/12/22 ORTHo NOTE- Austin Blankenship is a 37 y.o. male who presents today  for suture removal status post a open reduction internal fixation of right distal radius fracture in addition to a carpal tunnel release. The patient suffered a right distal radius fracture and underwent surgery with Dr. Rosita Kea on 06/28/2022. The patient had a complex distal radius fracture which did require use of both the volar and dorsal plate. The patient also suffered acute carpal tunnel at the time the injury and did have to undergo a carpal tunnel release. The patient was evaluated for skin check and was placed in a Velcro wrist splint, he was instructed to begin using his hand and flexing and extending his fingers. The patient presents today with still moderate swelling throughout the right hand, he does still report a burning in the median nerve distribution but does feel that the sensation is improving. He has been working on trying to make a full fist. He has been nonweightbearing to the right upper extremity. The patient is taking hydrocodone as needed for discomfort. Pain score today is a 7 out of 10. 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 No/denies                          OT Treatments/Exercises (OP) - 10/24/22 0001       RUE Contrast Bath   Time 8 minutes    Comments Prior to range of motion and scar tissue massage            Pt reports he is doing well, no pain, just stiffness and can feel some "pulling" sensations at times in hand and wrist.   Cleaned area well with alcohol swab and replaced strips in one small area.        Manual Therapy:   Following contrast, pt seen for carpal and metacarpal spreads to promote lymphatic flow and open pathways to decrease edema in  hand and wrist. Scar massage performed this date to decrease adhesions, increase tissue mobility and promote scar healing on volar scar at wrist- cica scar pad provided for volar scar .  Will plan to issue Cica Care next week if area is closed on dorsal scar   Therapeutic Exercise:    Active assisted range of motion with place and hold for supination as well as wrist extension over edge of table as well as radial/ulnar deviation. Keeping with a slight pull less than 2/10. Tendon gliding exercises with cues for form/technique  Table slides in standing Yellow flex bar wrist extension after table slides at home  And CPM for wrist extension  2 sets of 200 sec each.  Increased to 55 degrees for extension   Continue with contrast at home to target edema, 2-3 times a day Scar massage Continue with ROM exercises to focus on active motion        OT Education - 10/24/22 1940     Education Details progress and HEP changes    Person(s) Educated Patient    Methods Explanation;Demonstration;Tactile cues;Verbal cues;Handout    Comprehension Verbal cues required;Returned demonstration;Verbalized understanding              OT Short Term Goals - 10/24/22 1033       OT SHORT TERM GOAL #1   Title Patient to be independent in home program to decrease edema and increase motion for pain and to be able to lay fingers and wrist straight on table.    Status Achieved               OT Long Term Goals - 10/24/22 1033       OT LONG TERM GOAL #1   Title Digit flexion increased for patient to touch palm without increased symptoms to initiate using it in ADLs    Status Achieved      OT LONG TERM GOAL #2   Title Right wrist active range of motion increased to within functional limits for patient to use right hand in 50% of ADLs    Baseline Right wrist extension 5 degrees and flexion 30 degrees, ulnar deviation 5 degrees and radial deviation 12-not using the right hand or wrist  activities7/15/24:  Pt using right hand and wrist in activities but still working on increasing active ROM NOW plate remove dorsally 10/01/22 decrease AROM in all planes for wrist and forearm.  10/22/22:Right wrist extension 38 degrees, flex 50    Time 6    Period Weeks    Status On-going    Target Date 11/21/22  OT LONG TERM GOAL #3   Title Right forearm supination pronation increased to within functional limits for patient to turn doorknob with minimal symptoms    Baseline Patient decreased greatly in supination 50 and pronation 70, 09/02/22:  continued improvement in sup/pro but still working on end ranges, sup 75, pro 90 degrees NOW sup 75 in session 85-.  10/22/22:  80 degrees supination    Time 4    Period Weeks    Status On-going    Target Date 11/07/22      OT LONG TERM GOAL #4   Title Right digit extension as well as thumb palmar radial abduction within functional limits to put hand in pocket and don gloves with no issues    Status Achieved      OT LONG TERM GOAL #5   Title Strength in right hand and wrist for patient to be able to carry 5 pounds with no increase symptoms, push and pull heavy door and initiate using tools    Baseline Pt had dorsal plate removed 11/01/76 bandage in place - stitches coming out 10/14/22 - some increase stiffness in right hand and wrist- decrease strength in right hand,  and wrist.  10/22/22:  continues to work on stiffness and functional use    Time 8    Period Weeks    Status On-going    Target Date 12/05/22                   Plan - 10/24/22 1940     Clinical Impression Statement Patient presented OT evaluation with diagnosis of s/p  right distal radius fracture with volar and dorsal plate, and carpal tunnel release by Dr. Rosita Kea 06/28/2022.  Pt about 3 1/2 months s /p - Pt had dorsal plate removed 2/95/62 and return with v/o from Dr Rosita Kea to cont OT - to increase ROM - no restrictions.  Pt continues to demonstrate decreased AROM in wrist in all  planes and some stiffness in intrinsic and composite fist but reports he no longer feels a block with wrist movement.  Grip and prehension increasing. Pt cont to be limited by scar tissue - and stiffness in flexion and extention more than supination. COnt with CPM , table slides, focus on motion and strengthening- with focus on volar wrist scar- pt has open area where he reported it looked like stitch was there- pt with little drainage - encourage pt to contact Orthopedics.   Pt limited in functional use of right hand and wrist in ADLs and IADLs. Patient can benefit from skilled OT services to decrease scar tissue, and pain and increase motion and strength to return to prior level of function.    OT Occupational Profile and History Problem Focused Assessment - Including review of records relating to presenting problem    Occupational performance deficits (Please refer to evaluation for details): ADL's;IADL's;Work;Play;Social Participation;Leisure    Body Structure / Function / Physical Skills ADL;Decreased knowledge of precautions;Flexibility;ROM;UE functional use;Scar mobility;FMC;Dexterity;Sensation;Strength;Pain;IADL;Edema;Coordination    Rehab Potential Fair    Clinical Decision Making Limited treatment options, no task modification necessary    Comorbidities Affecting Occupational Performance: None    Modification or Assistance to Complete Evaluation  No modification of tasks or assist necessary to complete eval    OT Frequency 2x / week    OT Duration 8 weeks    OT Treatment/Interventions Self-care/ADL training;Therapeutic exercise;Patient/family education;Splinting;Paraffin;Fluidtherapy;Contrast Bath;DME and/or AE instruction;Manual Therapy;Passive range of motion;Scar mobilization;Therapeutic activities    Consulted and Agree with  Plan of Care Patient             Patient will benefit from skilled therapeutic intervention in order to improve the following deficits and impairments:   Body  Structure / Function / Physical Skills: ADL, Decreased knowledge of precautions, Flexibility, ROM, UE functional use, Scar mobility, FMC, Dexterity, Sensation, Strength, Pain, IADL, Edema, Coordination       Visit Diagnosis: Muscle weakness (generalized)  Stiffness of right hand, not elsewhere classified  Scar condition and fibrosis of skin  Stiffness of right wrist, not elsewhere classified    Problem List There are no problems to display for this patient.   Oletta Cohn, OTR/L,CLT 10/24/2022, 7:45 PM  [facility] [address] [place], [address] Phone: [phone]   Fax:  [phone]  Name: DENON SCHWABE MRN: [id number] Date of Birth: [birth year]

## 2022-10-29 ENCOUNTER — Ambulatory Visit: Payer: 59 | Admitting: Occupational Therapy

## 2022-10-29 DIAGNOSIS — M6281 Muscle weakness (generalized): Secondary | ICD-10-CM | POA: Diagnosis not present

## 2022-10-29 DIAGNOSIS — M25631 Stiffness of right wrist, not elsewhere classified: Secondary | ICD-10-CM

## 2022-10-29 DIAGNOSIS — L905 Scar conditions and fibrosis of skin: Secondary | ICD-10-CM

## 2022-10-29 DIAGNOSIS — M25531 Pain in right wrist: Secondary | ICD-10-CM

## 2022-10-29 DIAGNOSIS — R600 Localized edema: Secondary | ICD-10-CM

## 2022-10-29 DIAGNOSIS — M25641 Stiffness of right hand, not elsewhere classified: Secondary | ICD-10-CM

## 2022-10-29 DIAGNOSIS — M79644 Pain in right finger(s): Secondary | ICD-10-CM

## 2022-10-29 NOTE — Therapy (Signed)
[facility] [address], [place], [address] Phone: [phone]   Fax:  [phone]  Occupational Therapy Treatment  Patient Details  Name: Austin Blankenship MRN: [id number] Date of Birth: [birth year] Referring Provider (OT): [place]   Encounter Date: 10/29/2022   OT End of Session - 10/29/22 1351     Visit Number 22    Number of Visits 29    Date for OT Re-Evaluation 12/05/22    OT Start Time 1322    OT Stop Time 1401    OT Time Calculation (min) 39 min    Activity Tolerance Patient tolerated treatment well    Behavior During Therapy [facility] for tasks assessed/performed             Past Medical History:  Diagnosis Date   Medical history non-contributory     Past Surgical History:  Procedure Laterality Date   CARPAL TUNNEL RELEASE Right 06/28/2022   Procedure: CARPAL TUNNEL RELEASE;  Surgeon: [name], MD;  Location: [facility];  Service: Orthopedics;  Laterality: Right;   FRACTURE SURGERY     left leg.  age 37 or 70   HARDWARE REMOVAL Right 10/01/2022   Procedure: Right wrist dorsal plate removal;  Surgeon: [name], MD;  Location: [facility];  Service: Orthopedics;  Laterality: Right;  EXPLANTED 1 VOLAR PLATE AND 5 PEGS FROM RIGHT WRIST BY DR. Rosita Kea.   OPEN REDUCTION INTERNAL FIXATION (ORIF) DISTAL RADIAL FRACTURE Right 06/28/2022   Procedure: OPEN REDUCTION INTERNAL FIXATION (ORIF) DISTAL RADIUS FRACTURE;  Surgeon: [name], MD;  Location: [facility];  Service: Orthopedics;  Laterality: Right;   TONSILLECTOMY     and Adenoids as child    There were no vitals filed for this visit.   Subjective Assessment - 10/29/22 1347     Subjective  I did not my exercises but worked on my stretches on steering wheel and when sitting on my sides or armrest- and the putty    Pertinent History 07/12/22 ORTHo NOTE- Austin Blankenship is a 37 y.o. male who presents today for suture  removal status post a open reduction internal fixation of right distal radius fracture in addition to a carpal tunnel release. The patient suffered a right distal radius fracture and underwent surgery with Dr. Rosita Kea on 06/28/2022. The patient had a complex distal radius fracture which did require use of both the volar and dorsal plate. The patient also suffered acute carpal tunnel at the time the injury and did have to undergo a carpal tunnel release. The patient was evaluated for skin check and was placed in a Velcro wrist splint, he was instructed to begin using his hand and flexing and extending his fingers. The patient presents today with still moderate swelling throughout the right hand, he does still report a burning in the median nerve distribution but does feel that the sensation is improving. He has been working on trying to make a full fist. He has been nonweightbearing to the right upper extremity. The patient is taking hydrocodone as needed for discomfort. Pain score today is a 7 out of 10. 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 No/denies                [facility] OT Assessment - 10/29/22 0001       AROM   Right Wrist Extension 52 Degrees    Right Wrist Flexion 60 Degrees      Strength   Right Hand Grip (lbs) 42    Right Hand Lateral Pinch 20 lbs    Right Hand 3 Point Pinch 16 lbs               SUPINATION 85   Patient made great progress since last session and motion as well as grip and prehension     OT Treatments/Exercises (OP) - 10/29/22 0001       RUE Paraffin   Number Minutes Paraffin 8 Minutes    RUE Paraffin Location Wrist   hand   Comments sup and intrinsic stretch             Focus on passive range of motion to intrinsic a fist on the right followed by active assisted range of motion prior to composite fist touching palm. Patient to grip flex bar holding cylinder grip pushing into putty and twisting to wrist extension can do at home 15 reps and dark blue putty ) Supination with bicep curl done followed by using supination and therapy web with 1 kg ball bouncing 2 x 1 minute Using therapy web to kilogram ball using supination rolling ball left and right on palm as well as in circular working on radial ulnar deviation 2 x 1 minute  Extractor used for 6 proximal dorsal scar mobilization with wrist flexion and composite fisting. Patient to continue at home during the day focusing on composite wrist and fist flexion as well as wrist extension and endrange supination. Focus on intrinsic fist.       OT Education - 10/29/22 1351     Education Details progress and HEP changes    Person(s) Educated Patient    Methods Explanation;Demonstration;Tactile cues;Verbal cues;Handout    Comprehension Verbal cues required;Returned demonstration;Verbalized understanding              OT Short Term Goals - 10/24/22 1033       OT SHORT TERM GOAL #1   Title Patient to be independent in home program to decrease edema and increase motion for pain and to be able to lay fingers and wrist straight on table.    Status Achieved               OT Long Term Goals - 10/24/22 1033       OT LONG TERM GOAL #1   Title Digit flexion increased for patient to touch palm without increased symptoms to initiate using it in ADLs    Status Achieved      OT LONG TERM GOAL #2   Title Right wrist active range of motion increased to within functional limits for patient to use right hand in 50% of ADLs    Baseline Right wrist extension 5 degrees and flexion 30 degrees, ulnar deviation 5 degrees and radial deviation 12-not using the right hand or wrist activities7/15/24:  Pt  using right hand and wrist in activities but still working on increasing active ROM NOW plate remove dorsally 10/01/22 decrease AROM in all planes for wrist and forearm.  10/22/22:Right wrist extension 38 degrees, flex 50    Time 6    Period Weeks    Status On-going  Target Date 11/21/22      OT LONG TERM GOAL #3   Title Right forearm supination pronation increased to within functional limits for patient to turn doorknob with minimal symptoms    Baseline Patient decreased greatly in supination 50 and pronation 70, 09/02/22:  continued improvement in sup/pro but still working on end ranges, sup 75, pro 90 degrees NOW sup 75 in session 85-.  10/22/22:  80 degrees supination    Time 4    Period Weeks    Status On-going    Target Date 11/07/22      OT LONG TERM GOAL #4   Title Right digit extension as well as thumb palmar radial abduction within functional limits to put hand in pocket and don gloves with no issues    Status Achieved      OT LONG TERM GOAL #5   Title Strength in right hand and wrist for patient to be able to carry 5 pounds with no increase symptoms, push and pull heavy door and initiate using tools    Baseline Pt had dorsal plate removed 1/61/09 bandage in place - stitches coming out 10/14/22 - some increase stiffness in right hand and wrist- decrease strength in right hand,  and wrist.  10/22/22:  continues to work on stiffness and functional use    Time 8    Period Weeks    Status On-going    Target Date 12/05/22                   Plan - 10/29/22 1351     Clinical Impression Statement Patient presented OT evaluation with diagnosis of s/p  right distal radius fracture with volar and dorsal plate, and carpal tunnel release by Dr. Rosita Kea 06/28/2022.  Pt about 3 1/2 months s /p - Pt had dorsal plate removed 07/22/52 and return with v/o from Dr Rosita Kea to cont OT - to increase ROM - no restrictions.  Pt continues to demonstrate decreased AROM in wrist in all planes and some  stiffness in intrinsic and composite fist but reports he no longer feels a block with wrist movement.  Grip and prehension increasing. Pt cont to be limited by scar tissue - and stiffness in flexion and extention more than supination. But improved this past week.Pt to focus on stretch for sup, wrist ext and composite flexion and intrinsic fist.    Pt limited in functional use of right hand and wrist in ADLs and IADLs. Patient can benefit from skilled OT services to decrease scar tissue, and pain and increase motion and strength to return to prior level of function.    OT Occupational Profile and History Problem Focused Assessment - Including review of records relating to presenting problem    Occupational performance deficits (Please refer to evaluation for details): ADL's;IADL's;Work;Play;Social Participation;Leisure    Body Structure / Function / Physical Skills ADL;Decreased knowledge of precautions;Flexibility;ROM;UE functional use;Scar mobility;FMC;Dexterity;Sensation;Strength;Pain;IADL;Edema;Coordination    Rehab Potential Fair    Clinical Decision Making Limited treatment options, no task modification necessary    Comorbidities Affecting Occupational Performance: None    Modification or Assistance to Complete Evaluation  No modification of tasks or assist necessary to complete eval    OT Frequency 2x / week    OT Duration 8 weeks    OT Treatment/Interventions Self-care/ADL training;Therapeutic exercise;Patient/family education;Splinting;Paraffin;Fluidtherapy;Contrast Bath;DME and/or AE instruction;Manual Therapy;Passive range of motion;Scar mobilization;Therapeutic activities    Consulted and Agree with Plan of Care Patient  Patient will benefit from skilled therapeutic intervention in order to improve the following deficits and impairments:   Body Structure / Function / Physical Skills: ADL, Decreased knowledge of precautions, Flexibility, ROM, UE functional use, Scar mobility,  FMC, Dexterity, Sensation, Strength, Pain, IADL, Edema, Coordination       Visit Diagnosis: Muscle weakness (generalized)  Stiffness of right hand, not elsewhere classified  Scar condition and fibrosis of skin  Stiffness of right wrist, not elsewhere classified  Pain in right wrist  Localized edema  Pain in right finger(s)    Problem List There are no problems to display for this patient.   Oletta Cohn, OTR/L,CLT 10/29/2022, 3:32 PM  [facility] [address], [place], [address] Phone: [phone]   Fax:  [phone]  Name: DANIELL TINDER MRN: [id number] Date of Birth: 09/29/1985

## 2022-11-01 ENCOUNTER — Ambulatory Visit: Payer: 59 | Admitting: Occupational Therapy

## 2022-11-01 DIAGNOSIS — M79644 Pain in right finger(s): Secondary | ICD-10-CM

## 2022-11-01 DIAGNOSIS — M25631 Stiffness of right wrist, not elsewhere classified: Secondary | ICD-10-CM

## 2022-11-01 DIAGNOSIS — M6281 Muscle weakness (generalized): Secondary | ICD-10-CM | POA: Diagnosis not present

## 2022-11-01 DIAGNOSIS — L905 Scar conditions and fibrosis of skin: Secondary | ICD-10-CM

## 2022-11-01 DIAGNOSIS — M25641 Stiffness of right hand, not elsewhere classified: Secondary | ICD-10-CM

## 2022-11-01 DIAGNOSIS — M25531 Pain in right wrist: Secondary | ICD-10-CM

## 2022-11-01 NOTE — Therapy (Signed)
[facility] [address], [place], [address] Phone: [phone]   Fax:  [phone]  Occupational Therapy Treatment  Patient Details  Name: Austin Blankenship MRN: [id number] Date of Birth: 12-14-1985 Referring Provider (OT): [place]   Encounter Date: 11/01/2022   OT End of Session - 11/01/22 0858     Visit Number 23    Number of Visits 29    Date for OT Re-Evaluation 12/05/22    OT Start Time 0900    OT Stop Time 0950    OT Time Calculation (min) 50 min    Activity Tolerance Patient tolerated treatment well    Behavior During Therapy [facility] for tasks assessed/performed             Past Medical History:  Diagnosis Date   Medical history non-contributory     Past Surgical History:  Procedure Laterality Date   CARPAL TUNNEL RELEASE Right 06/28/2022   Procedure: CARPAL TUNNEL RELEASE;  Surgeon: [name], MD;  Location: [facility];  Service: Orthopedics;  Laterality: Right;   FRACTURE SURGERY     left leg.  age 37 or 37   HARDWARE REMOVAL Right 10/01/2022   Procedure: Right wrist dorsal plate removal;  Surgeon: [name], MD;  Location: [facility];  Service: Orthopedics;  Laterality: Right;  EXPLANTED 1 VOLAR PLATE AND 5 PEGS FROM RIGHT WRIST BY DR. Rosita Kea.   OPEN REDUCTION INTERNAL FIXATION (ORIF) DISTAL RADIAL FRACTURE Right 06/28/2022   Procedure: OPEN REDUCTION INTERNAL FIXATION (ORIF) DISTAL RADIUS FRACTURE;  Surgeon: [name], MD;  Location: [facility];  Service: Orthopedics;  Laterality: Right;   TONSILLECTOMY     and Adenoids as child    There were no vitals filed for this visit.   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 male who  presents today for suture removal status post a open reduction internal fixation of right distal radius fracture in addition to a carpal tunnel release. The patient suffered a right distal radius fracture and underwent surgery with Dr. Rosita Kea on 06/28/2022. The patient had a complex distal radius fracture which did require use of both the volar and dorsal plate. The patient also suffered acute carpal tunnel at the time the injury and did have to undergo a carpal tunnel release. The patient was evaluated for skin check and was placed in a Velcro wrist splint, he was instructed to begin using his hand and flexing and extending his fingers. The patient presents today with still moderate swelling throughout the right hand, he does still report a burning in the median nerve distribution but does feel that the sensation is improving. He has been working on trying to make a full fist. He has been nonweightbearing to the right upper extremity. The patient is taking hydrocodone as needed for discomfort. Pain score today is a 7 out of 10. He denies any repeat trauma or injury- NOW Return dorsal plate remove 1/32/44 - bandage on - stitches plan to be removed on 10/14/22 - refer to OT - no restrictions per Dr Rosita Kea - v/o to cont OT    Patient Stated Goals Want the swelling, motion and strength back in my  Patient             Patient will benefit from skilled therapeutic intervention in order to improve the following deficits and impairments:   Body Structure / Function / Physical Skills: ADL, Decreased knowledge of precautions, Flexibility, ROM, UE functional use, Scar mobility, FMC, Dexterity, Sensation, Strength, Pain, IADL, Edema, Coordination       Visit Diagnosis: Muscle  weakness (generalized)  Stiffness of right hand, not elsewhere classified  Scar condition and fibrosis of skin  Stiffness of right wrist, not elsewhere classified  Pain in right wrist  Pain in right finger(s)    Problem List There are no problems to display for this patient.   Oletta Cohn, OTR/L,CLT 11/01/2022, 3:28 PM  [facility] [address], [place], [address] Phone: [phone]   Fax:  [phone]  Name: Austin Blankenship MRN: [id number] Date of Birth: 1985/11/24  [facility] [address], [place], [address] Phone: [phone]   Fax:  [phone]  Occupational Therapy Treatment  Patient Details  Name: Austin Blankenship MRN: [id number] Date of Birth: 12-14-1985 Referring Provider (OT): [place]   Encounter Date: 11/01/2022   OT End of Session - 11/01/22 0858     Visit Number 23    Number of Visits 29    Date for OT Re-Evaluation 12/05/22    OT Start Time 0900    OT Stop Time 0950    OT Time Calculation (min) 50 min    Activity Tolerance Patient tolerated treatment well    Behavior During Therapy [facility] for tasks assessed/performed             Past Medical History:  Diagnosis Date   Medical history non-contributory     Past Surgical History:  Procedure Laterality Date   CARPAL TUNNEL RELEASE Right 06/28/2022   Procedure: CARPAL TUNNEL RELEASE;  Surgeon: [name], MD;  Location: [facility];  Service: Orthopedics;  Laterality: Right;   FRACTURE SURGERY     left leg.  age 37 or 37   HARDWARE REMOVAL Right 10/01/2022   Procedure: Right wrist dorsal plate removal;  Surgeon: [name], MD;  Location: [facility];  Service: Orthopedics;  Laterality: Right;  EXPLANTED 1 VOLAR PLATE AND 5 PEGS FROM RIGHT WRIST BY DR. Rosita Kea.   OPEN REDUCTION INTERNAL FIXATION (ORIF) DISTAL RADIAL FRACTURE Right 06/28/2022   Procedure: OPEN REDUCTION INTERNAL FIXATION (ORIF) DISTAL RADIUS FRACTURE;  Surgeon: [name], MD;  Location: [facility];  Service: Orthopedics;  Laterality: Right;   TONSILLECTOMY     and Adenoids as child    There were no vitals filed for this visit.   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 male who  presents today for suture removal status post a open reduction internal fixation of right distal radius fracture in addition to a carpal tunnel release. The patient suffered a right distal radius fracture and underwent surgery with Dr. Rosita Kea on 06/28/2022. The patient had a complex distal radius fracture which did require use of both the volar and dorsal plate. The patient also suffered acute carpal tunnel at the time the injury and did have to undergo a carpal tunnel release. The patient was evaluated for skin check and was placed in a Velcro wrist splint, he was instructed to begin using his hand and flexing and extending his fingers. The patient presents today with still moderate swelling throughout the right hand, he does still report a burning in the median nerve distribution but does feel that the sensation is improving. He has been working on trying to make a full fist. He has been nonweightbearing to the right upper extremity. The patient is taking hydrocodone as needed for discomfort. Pain score today is a 7 out of 10. He denies any repeat trauma or injury- NOW Return dorsal plate remove 1/32/44 - bandage on - stitches plan to be removed on 10/14/22 - refer to OT - no restrictions per Dr Rosita Kea - v/o to cont OT    Patient Stated Goals Want the swelling, motion and strength back in my  Patient             Patient will benefit from skilled therapeutic intervention in order to improve the following deficits and impairments:   Body Structure / Function / Physical Skills: ADL, Decreased knowledge of precautions, Flexibility, ROM, UE functional use, Scar mobility, FMC, Dexterity, Sensation, Strength, Pain, IADL, Edema, Coordination       Visit Diagnosis: Muscle  weakness (generalized)  Stiffness of right hand, not elsewhere classified  Scar condition and fibrosis of skin  Stiffness of right wrist, not elsewhere classified  Pain in right wrist  Pain in right finger(s)    Problem List There are no problems to display for this patient.   Oletta Cohn, OTR/L,CLT 11/01/2022, 3:28 PM  [facility] [address], [place], [address] Phone: [phone]   Fax:  [phone]  Name: Austin Blankenship MRN: [id number] Date of Birth: 1985/11/24

## 2022-11-05 ENCOUNTER — Ambulatory Visit: Payer: 59 | Admitting: Occupational Therapy

## 2022-11-05 DIAGNOSIS — L905 Scar conditions and fibrosis of skin: Secondary | ICD-10-CM

## 2022-11-05 DIAGNOSIS — M25641 Stiffness of right hand, not elsewhere classified: Secondary | ICD-10-CM

## 2022-11-05 DIAGNOSIS — M6281 Muscle weakness (generalized): Secondary | ICD-10-CM

## 2022-11-05 DIAGNOSIS — M25531 Pain in right wrist: Secondary | ICD-10-CM

## 2022-11-05 DIAGNOSIS — M25631 Stiffness of right wrist, not elsewhere classified: Secondary | ICD-10-CM

## 2022-11-05 NOTE — Therapy (Signed)
[facility] [address], [place], [address] Phone: [phone]   Fax:  [phone]  Occupational Therapy Treatment  Patient Details  Name: Austin Blankenship MRN: [id number] Date of Birth: [birth year] Referring Provider (OT): [place] PA   Encounter Date: 11/05/2022   OT End of Session - 11/05/22 1250     Visit Number 24    Number of Visits 29    Date for OT Re-Evaluation 12/05/22    OT Start Time 1200    OT Stop Time 1244    OT Time Calculation (min) 44 min    Activity Tolerance Patient tolerated treatment well    Behavior During Therapy [facility] for tasks assessed/performed             Past Medical History:  Diagnosis Date   Medical history non-contributory     Past Surgical History:  Procedure Laterality Date   CARPAL TUNNEL RELEASE Right 06/28/2022   Procedure: CARPAL TUNNEL RELEASE;  Surgeon: [name], MD;  Location: [facility];  Service: Orthopedics;  Laterality: Right;   FRACTURE SURGERY     left leg.  age 37 or 57   HARDWARE REMOVAL Right 10/01/2022   Procedure: Right wrist dorsal plate removal;  Surgeon: [name], MD;  Location: [facility];  Service: Orthopedics;  Laterality: Right;  EXPLANTED 1 VOLAR PLATE AND 5 PEGS FROM RIGHT WRIST BY DR. Rosita Kea.   OPEN REDUCTION INTERNAL FIXATION (ORIF) DISTAL RADIAL FRACTURE Right 06/28/2022   Procedure: OPEN REDUCTION INTERNAL FIXATION (ORIF) DISTAL RADIUS FRACTURE;  Surgeon: [name], MD;  Location: [facility];  Service: Orthopedics;  Laterality: Right;   TONSILLECTOMY     and Adenoids as child    There were no vitals filed for this visit.   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 male who presents today for suture removal status post a open reduction  internal fixation of right distal radius fracture in addition to a carpal tunnel release. The patient suffered a right distal radius fracture and underwent surgery with Dr. Rosita Kea on 06/28/2022. The patient had a complex distal radius fracture which did require use of both the volar and dorsal plate. The patient also suffered acute carpal tunnel at the time the injury and did have to undergo a carpal tunnel release. The patient was evaluated for skin check and was placed in a Velcro wrist splint, he was instructed to begin using his hand and flexing and extending his fingers. The patient presents today with still moderate swelling throughout the right hand, he does still report a burning in the median nerve distribution but does feel that the sensation is improving. He has been working on trying to make a full fist. He has been nonweightbearing to the right upper extremity. The patient is taking hydrocodone as needed for discomfort. Pain score today is a 7 out of 10. 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   Oletta Cohn,  OTR/L,CLT 11/05/2022, 12:54 PM  [facility] [address] [place], [address] Phone: [phone]   Fax:  [phone]  Name: Austin Blankenship MRN: [id number] Date of Birth: Apr 28, 1985  the following deficits and impairments:   Body Structure / Function / Physical Skills: ADL, Decreased knowledge of precautions, Flexibility, ROM, UE functional use, Scar mobility, FMC, Dexterity, Sensation, Strength, Pain, IADL, Edema, Coordination       Visit Diagnosis: Muscle weakness (generalized)  Stiffness of right hand, not elsewhere classified  Scar condition and fibrosis of skin  Stiffness of right wrist, not elsewhere classified  Pain in right wrist    Problem List There are no problems to display for this patient.   Oletta Cohn,  OTR/L,CLT 11/05/2022, 12:54 PM  [place] [facility] [address] [place], [address] Phone: [phone]   Fax:  [phone]  Name: Austin Blankenship MRN: [id number] Date of Birth: Apr 28, 1985  the following deficits and impairments:   Body Structure / Function / Physical Skills: ADL, Decreased knowledge of precautions, Flexibility, ROM, UE functional use, Scar mobility, FMC, Dexterity, Sensation, Strength, Pain, IADL, Edema, Coordination       Visit Diagnosis: Muscle weakness (generalized)  Stiffness of right hand, not elsewhere classified  Scar condition and fibrosis of skin  Stiffness of right wrist, not elsewhere classified  Pain in right wrist    Problem List There are no problems to display for this patient.   Oletta Cohn,  OTR/L,CLT 11/05/2022, 12:54 PM  [place] [facility] [address] [place], [address] Phone: [phone]   Fax:  [phone]  Name: Austin Blankenship MRN: [id number] Date of Birth: Apr 28, 1985

## 2022-11-07 ENCOUNTER — Ambulatory Visit: Payer: 59 | Admitting: Occupational Therapy

## 2022-11-07 DIAGNOSIS — M79644 Pain in right finger(s): Secondary | ICD-10-CM

## 2022-11-07 DIAGNOSIS — M6281 Muscle weakness (generalized): Secondary | ICD-10-CM

## 2022-11-07 DIAGNOSIS — L905 Scar conditions and fibrosis of skin: Secondary | ICD-10-CM

## 2022-11-07 DIAGNOSIS — M25631 Stiffness of right wrist, not elsewhere classified: Secondary | ICD-10-CM

## 2022-11-07 DIAGNOSIS — M25641 Stiffness of right hand, not elsewhere classified: Secondary | ICD-10-CM

## 2022-11-07 DIAGNOSIS — M25531 Pain in right wrist: Secondary | ICD-10-CM

## 2022-11-07 NOTE — Therapy (Signed)
[facility] [address] [place], [address] Phone: [phone]   Fax:  [phone]  Occupational Therapy Treatment  Patient Details  Name: Austin Blankenship MRN: [id number] Date of Birth: 1986/02/08 Referring Provider (OT): [place] PA   Encounter Date: 11/07/2022   OT End of Session - 11/07/22 1300     Visit Number 25    Number of Visits 29    Date for OT Re-Evaluation 12/05/22    OT Start Time 1200    OT Stop Time 1252    OT Time Calculation (min) 52 min    Activity Tolerance Patient tolerated treatment well    Behavior During Therapy [facility] for tasks assessed/performed             Past Medical History:  Diagnosis Date   Medical history non-contributory     Past Surgical History:  Procedure Laterality Date   CARPAL TUNNEL RELEASE Right 06/28/2022   Procedure: CARPAL TUNNEL RELEASE;  Surgeon: [name], MD;  Location: [facility];  Service: Orthopedics;  Laterality: Right;   FRACTURE SURGERY     left leg.  age 58 or 28   HARDWARE REMOVAL Right 10/01/2022   Procedure: Right wrist dorsal plate removal;  Surgeon: [name], MD;  Location: [facility];  Service: Orthopedics;  Laterality: Right;  EXPLANTED 1 VOLAR PLATE AND 5 PEGS FROM RIGHT WRIST BY DR. Rosita Kea.   OPEN REDUCTION INTERNAL FIXATION (ORIF) DISTAL RADIAL FRACTURE Right 06/28/2022   Procedure: OPEN REDUCTION INTERNAL FIXATION (ORIF) DISTAL RADIUS FRACTURE;  Surgeon: [name], MD;  Location: [facility];  Service: Orthopedics;  Laterality: Right;   TONSILLECTOMY     and Adenoids as child    There were no vitals filed for this visit.   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 male who presents today for suture removal status post a open reduction  internal fixation of right distal radius fracture in addition to a carpal tunnel release. The patient suffered a right distal radius fracture and underwent surgery with Dr. Rosita Kea on 06/28/2022. The patient had a complex distal radius fracture which did require use of both the volar and dorsal plate. The patient also suffered acute carpal tunnel at the time the injury and did have to undergo a carpal tunnel release. The patient was evaluated for skin check and was placed in a Velcro wrist splint, he was instructed to begin using his hand and flexing and extending his fingers. The patient presents today with still moderate swelling throughout the right hand, he does still report a burning in the median nerve distribution but does feel that the sensation is improving. He has been working on trying to make a full fist. He has been nonweightbearing to the right upper extremity. The patient is taking hydrocodone as needed for discomfort. Pain score today is a 7 out of 10. 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 No/denies                [facility] OT Assessment - 11/07/22 0001       AROM   Right Forearm Pronation 90 Degrees    Right Forearm Supination 85 Degrees    Right Wrist Extension 65 Degrees    Right Wrist Flexion 70 Degrees    Right Wrist Radial Deviation 28 Degrees    Right Wrist Ulnar Deviation 25 Degrees      Strength   Right Hand Grip (lbs) 45    Right Hand Lateral Pinch 21 lbs    Right Hand 3 Point Pinch 17 lbs    Left Hand Grip (lbs) 100    Left Hand Lateral Pinch 27 lbs    Left Hand 3 Point Pinch 23 lbs               Assess patient's precision pinch and fine motor.  Picking up 5  objects and retrieving from palm 9 seconds on the left 11 seconds on the right.  Decreased composite flexion of second digit. Patient to work on manipulation of medium and small objects retrieving object from palm using second and third digit flexion at DIP and PIP  PRogressing well last 2 weeks in wrist flexion and extension and supination Patient to continue table slides for wrist ext 20 reps Wall slides attempt and pt to do at home - in shower  Wall pushup 10 reps - but above shoulder height    BTE for wrist extension and flexion CPM 200 seconds Followed by large screwdriver 2 lbs - 120 sec And flexion 30 pounds pushing into flexion some discomfort at distal volar scar  Gripper 120 sec at 45 lbs  Continue at home gripping pulling with ulnar hand dark blue putty 20 reps Lat and 3 point pinch into short sausage putty - 2 reps  X 6  12 reps 2 x day   1 kg ball Thrown Rebounder with Right Hand 1 Minute Catch and throw bilateral chest throw 2 kilogram ball 1 minute on the rebounder Followed by dribbling and shooting basketball    Able to carry 10 lbs - but lift in neutral about 8 lbs - and palm down 4 lbs              OT Education - 11/07/22 1259     Education Details progress and HEP changes    Person(s) Educated Patient    Methods Explanation;Demonstration;Tactile cues;Verbal cues;Handout    Comprehension Verbal cues required;Returned demonstration;Verbalized understanding              OT Short Term Goals - 10/24/22 1033       OT SHORT TERM GOAL #1   Title Patient to be independent in home program to decrease edema and increase motion for pain and to be able to lay fingers and wrist straight on table.    Status Achieved               OT Long Term Goals - 10/24/22 1033       OT LONG TERM GOAL #1   Title Digit flexion increased for patient to touch palm without increased symptoms to initiate using it in ADLs    Status Achieved      OT LONG TERM  GOAL #2   Title Right wrist active range of motion increased to within functional limits for patient to use right hand in 50% of ADLs    Baseline Right  wrist extension 5 degrees and flexion 30 degrees, ulnar deviation 5 degrees and radial deviation 12-not using the right hand or wrist activities7/15/24:  Pt using right hand and wrist in activities but still working on increasing active ROM NOW plate remove dorsally 10/01/22 decrease AROM in all planes for wrist and forearm.  10/22/22:Right wrist extension 38 degrees, flex 50    Time 6    Period Weeks    Status On-going    Target Date 11/21/22      OT LONG TERM GOAL #3   Title Right forearm supination pronation increased to within functional limits for patient to turn doorknob with minimal symptoms    Baseline Patient decreased greatly in supination 50 and pronation 70, 09/02/22:  continued improvement in sup/pro but still working on end ranges, sup 75, pro 90 degrees NOW sup 75 in session 85-.  10/22/22:  80 degrees supination    Time 4    Period Weeks    Status On-going    Target Date 11/07/22      OT LONG TERM GOAL #4   Title Right digit extension as well as thumb palmar radial abduction within functional limits to put hand in pocket and don gloves with no issues    Status Achieved      OT LONG TERM GOAL #5   Title Strength in right hand and wrist for patient to be able to carry 5 pounds with no increase symptoms, push and pull heavy door and initiate using tools    Baseline Pt had dorsal plate removed 1/61/09 bandage in place - stitches coming out 10/14/22 - some increase stiffness in right hand and wrist- decrease strength in right hand,  and wrist.  10/22/22:  continues to work on stiffness and functional use    Time 8    Period Weeks    Status On-going    Target Date 12/05/22                   Plan - 11/07/22 1300     Clinical Impression Statement Patient presented OT evaluation with diagnosis of s/p  right distal radius  fracture with volar and dorsal plate, and carpal tunnel release by Dr. Rosita Kea 06/28/2022.  Pt about 4 months s /p - Pt had dorsal plate removed 07/22/52 and return with v/o from Dr Rosita Kea to cont OT - to increase ROM - no restrictions.  Pt report  he no longer feels a block with wrist movement.   Pt made the last 2 wks great progress in wrist flexion, ext and supination - able to adress strength more and grip/prehension strength. Cont to be limited by scar tissue - and stiffness in flexion and extention- and last 5 degrees of supination. Pt to focus on stretches  for  end range sup, wrist ext and composite flexion and flexin of 2nd digit.  Pt show inrease functional use of R hand -   Pt limited in functional use of right hand and wrist in ADLs and IADLs. Patient can benefit from skilled OT services to decrease scar tissue, and pain and increase motion and strength to return to prior level of function.    OT Occupational Profile and History Problem Focused Assessment - Including review of records relating to presenting problem    Occupational performance deficits (Please refer to evaluation for details): ADL's;IADL's;Work;Play;Social Participation;Leisure    Body Structure / Function / Physical Skills ADL;Decreased knowledge of precautions;Flexibility;ROM;UE functional use;Scar mobility;FMC;Dexterity;Sensation;Strength;Pain;IADL;Edema;Coordination    Rehab Potential Fair  Clinical Decision Making Limited treatment options, no task modification necessary    Comorbidities Affecting Occupational Performance: None    Modification or Assistance to Complete Evaluation  No modification of tasks or assist necessary to complete eval    OT Frequency 2x / week    OT Duration 8 weeks    OT Treatment/Interventions Self-care/ADL training;Therapeutic exercise;Patient/family education;Splinting;Paraffin;Fluidtherapy;Contrast Bath;DME and/or AE instruction;Manual Therapy;Passive range of motion;Scar mobilization;Therapeutic  activities    Consulted and Agree with Plan of Care Patient             Patient will benefit from skilled therapeutic intervention in order to improve the following deficits and impairments:   Body Structure / Function / Physical Skills: ADL, Decreased knowledge of precautions, Flexibility, ROM, UE functional use, Scar mobility, FMC, Dexterity, Sensation, Strength, Pain, IADL, Edema, Coordination       Visit Diagnosis: Muscle weakness (generalized)  Stiffness of right hand, not elsewhere classified  Scar condition and fibrosis of skin  Stiffness of right wrist, not elsewhere classified  Pain in right wrist  Pain in right finger(s)    Problem List There are no problems to display for this patient.   Oletta Cohn, OTR/L,CLT 11/07/2022, 1:04 PM  [facility] [address], [place], [address] Phone: [phone]   Fax:  [phone]  Name: ANTARIO DELFINO MRN: [id number] Date of Birth: 08-11-1985

## 2022-11-12 ENCOUNTER — Ambulatory Visit: Payer: 59 | Admitting: Occupational Therapy

## 2022-11-14 ENCOUNTER — Ambulatory Visit: Payer: 59 | Admitting: Occupational Therapy

## 2022-11-14 DIAGNOSIS — M6281 Muscle weakness (generalized): Secondary | ICD-10-CM | POA: Diagnosis not present

## 2022-11-14 DIAGNOSIS — M25641 Stiffness of right hand, not elsewhere classified: Secondary | ICD-10-CM

## 2022-11-14 DIAGNOSIS — M25631 Stiffness of right wrist, not elsewhere classified: Secondary | ICD-10-CM

## 2022-11-14 DIAGNOSIS — L905 Scar conditions and fibrosis of skin: Secondary | ICD-10-CM

## 2022-11-14 DIAGNOSIS — M25531 Pain in right wrist: Secondary | ICD-10-CM

## 2022-11-14 NOTE — Therapy (Signed)
[facility] [address], [place], [address] Phone: [phone]   Fax:  [phone]  Occupational Therapy Treatment  Patient Details  Name: Austin Blankenship MRN: [id number] Date of Birth: 1985-05-24 Referring Provider (OT): [place] PA   Encounter Date: 11/14/2022   OT End of Session - 11/14/22 1643     Visit Number 26    Number of Visits 29    Date for OT Re-Evaluation 12/05/22    OT Start Time 1201    OT Stop Time 1247    OT Time Calculation (min) 46 min    Activity Tolerance Patient tolerated treatment well    Behavior During Therapy [facility] for tasks assessed/performed             Past Medical History:  Diagnosis Date   Medical history non-contributory     Past Surgical History:  Procedure Laterality Date   CARPAL TUNNEL RELEASE Right 06/28/2022   Procedure: CARPAL TUNNEL RELEASE;  Surgeon: [name], MD;  Location: [facility];  Service: Orthopedics;  Laterality: Right;   FRACTURE SURGERY     left leg.  age 60 or 52   HARDWARE REMOVAL Right 10/01/2022   Procedure: Right wrist dorsal plate removal;  Surgeon: [name], MD;  Location: [facility];  Service: Orthopedics;  Laterality: Right;  EXPLANTED 1 VOLAR PLATE AND 5 PEGS FROM RIGHT WRIST BY DR. Rosita Kea.   OPEN REDUCTION INTERNAL FIXATION (ORIF) DISTAL RADIAL FRACTURE Right 06/28/2022   Procedure: OPEN REDUCTION INTERNAL FIXATION (ORIF) DISTAL RADIUS FRACTURE;  Surgeon: [name], MD;  Location: [facility];  Service: Orthopedics;  Laterality: Right;   TONSILLECTOMY     and Adenoids as child    There were no vitals filed for this visit.   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 male who presents today  for suture removal status post a open reduction internal fixation of right distal radius fracture in addition to a carpal tunnel release. The patient suffered a right distal radius fracture and underwent surgery with Dr. Rosita Kea on 06/28/2022. The patient had a complex distal radius fracture which did require use of both the volar and dorsal plate. The patient also suffered acute carpal tunnel at the time the injury and did have to undergo a carpal tunnel release. The patient was evaluated for skin check and was placed in a Velcro wrist splint, he was instructed to begin using his hand and flexing and extending his fingers. The patient presents today with still moderate swelling throughout the right hand, he does still report a burning in the median nerve distribution but does feel that the sensation is improving. He has been working on trying to make a full fist. He has been nonweightbearing to the right upper extremity. The patient is taking hydrocodone as needed for discomfort. Pain score today is a 7 out of 10. 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 No/denies                [facility] OT Assessment - 11/14/22 0001       AROM   Right Forearm Supination 85 Degrees    Right Wrist Extension 65 Degrees    Right Wrist Flexion 60 Degrees    Right Wrist Radial Deviation 30 Degrees    Right Wrist Ulnar Deviation 25 Degrees      Strength   Right Hand Grip (lbs) 42    Right Hand Lateral Pinch 22 lbs    Right Hand 3 Point Pinch 18 lbs    Left Hand Grip (lbs) 100    Left Hand Lateral Pinch 27 lbs    Left Hand 3 Point Pinch 23 lbs                      OT Treatments/Exercises (OP) - 11/14/22 0001        RUE Paraffin   Number Minutes Paraffin 8 Minutes    RUE Paraffin Location Wrist    Comments intrinsic and wrist flexion stretch                Patient to continue table slides for wrist ext 20 reps Wall slides attempt and pt to do at home - in shower  Wall pushup 10 reps - but above shoulder height    BTE for wrist extension and flexion CPM 200 seconds 3 lbs place and hold painfree wrist exte 12 reps   Pt to do PROM and stretches prior to place and hold  REINFORCE  UD and RD and sup/pro 3 lbs  15 reps  Pain free  HEP add for 2 x day   5 lbs for elbow flexion wrist neutral, scapula retraction , shoulder ext and punches over head - wrist neutral  12 reps pain free  For conditioning   Continue at home gripping pulling with ulnar hand dark blue putty 20 reps Lat and 3 point pinch into short sausage putty - 2 reps  X 6  12 reps 2 x day    Able to carry 10 lbs - but lift in neutral about 8 lbs - and palm down 4 lbs        OT Education - 11/14/22 1643     Education Details progress and HEP changes    Person(s) Educated Patient    Methods Explanation;Demonstration;Tactile cues;Verbal cues;Handout    Comprehension Verbal cues required;Returned demonstration;Verbalized understanding              OT Short Term Goals - 10/24/22 1033       OT SHORT TERM GOAL #1   Title Patient to be independent in home program to decrease edema and increase motion for pain and to be able to lay fingers and wrist straight on table.    Status Achieved               OT Long Term Goals - 10/24/22 1033       OT LONG TERM GOAL #1   Title Digit flexion increased for patient to touch palm without increased symptoms to initiate using it in ADLs    Status Achieved      OT LONG TERM GOAL #2   Title Right wrist active range of motion increased to within functional limits for patient to use right hand in 50% of ADLs    Baseline Right wrist extension  5 degrees and flexion 30  degrees, ulnar deviation 5 degrees and radial deviation 12-not using the right hand or wrist activities7/15/24:  Pt using right hand and wrist in activities but still working on increasing active ROM NOW plate remove dorsally 10/01/22 decrease AROM in all planes for wrist and forearm.  10/22/22:Right wrist extension 38 degrees, flex 50    Time 6    Period Weeks    Status On-going    Target Date 11/21/22      OT LONG TERM GOAL #3   Title Right forearm supination pronation increased to within functional limits for patient to turn doorknob with minimal symptoms    Baseline Patient decreased greatly in supination 50 and pronation 70, 09/02/22:  continued improvement in sup/pro but still working on end ranges, sup 75, pro 90 degrees NOW sup 75 in session 85-.  10/22/22:  80 degrees supination    Time 4    Period Weeks    Status On-going    Target Date 11/07/22      OT LONG TERM GOAL #4   Title Right digit extension as well as thumb palmar radial abduction within functional limits to put hand in pocket and don gloves with no issues    Status Achieved      OT LONG TERM GOAL #5   Title Strength in right hand and wrist for patient to be able to carry 5 pounds with no increase symptoms, push and pull heavy door and initiate using tools    Baseline Pt had dorsal plate removed 4/40/10 bandage in place - stitches coming out 10/14/22 - some increase stiffness in right hand and wrist- decrease strength in right hand,  and wrist.  10/22/22:  continues to work on stiffness and functional use    Time 8    Period Weeks    Status On-going    Target Date 12/05/22                   Plan - 11/14/22 1643     Clinical Impression Statement Patient presented OT evaluation with diagnosis of s/p  right distal radius fracture with volar and dorsal plate, and carpal tunnel release by Dr. Rosita Kea 06/28/2022.  Pt about 4 months s /p - Pt had dorsal plate removed 2/72/53 and return with v/o from Dr Rosita Kea to cont OT - to  increase ROM - no restrictions.  Pt report  he no longer feels a block with wrist movement.   Pt made the last 2 wks great progress in wrist flexion, ext and supination - able to adress strength more and grip/prehension strength. Cont to be limited by scar tissue - and stiffness in flexion and extention- and last 5 degrees of supination. Pt to focus on stretches  for  end range sup, wrist ext and composite flexion and flexin of 2nd digit.  Prior to strengthening - can carry 8 lbs -and lift palm down 4 lbs - feel pull . Initiated wrist 2-3 lbs for HEP and elbow and shoulder with wrist in neutral 5 lbs- painfree . Pt show inrease functional use of R hand -   Pt limited in functional use of right hand and wrist in ADLs and IADLs. Patient can benefit from skilled OT services to decrease scar tissue, and pain and increase motion and strength to return to prior level of function.    OT Occupational Profile and History Problem Focused Assessment - Including review of records relating to presenting problem  Occupational performance deficits (Please refer to evaluation for details): ADL's;IADL's;Work;Play;Social Participation;Leisure    Body Structure / Function / Physical Skills ADL;Decreased knowledge of precautions;Flexibility;ROM;UE functional use;Scar mobility;FMC;Dexterity;Sensation;Strength;Pain;IADL;Edema;Coordination    Rehab Potential Fair    Clinical Decision Making Limited treatment options, no task modification necessary    Comorbidities Affecting Occupational Performance: None    Modification or Assistance to Complete Evaluation  No modification of tasks or assist necessary to complete eval    OT Frequency 2x / week    OT Duration 8 weeks    OT Treatment/Interventions Self-care/ADL training;Therapeutic exercise;Patient/family education;Splinting;Paraffin;Fluidtherapy;Contrast Bath;DME and/or AE instruction;Manual Therapy;Passive range of motion;Scar mobilization;Therapeutic activities    Consulted  and Agree with Plan of Care Patient             Patient will benefit from skilled therapeutic intervention in order to improve the following deficits and impairments:   Body Structure / Function / Physical Skills: ADL, Decreased knowledge of precautions, Flexibility, ROM, UE functional use, Scar mobility, FMC, Dexterity, Sensation, Strength, Pain, IADL, Edema, Coordination       Visit Diagnosis: Muscle weakness (generalized)  Stiffness of right hand, not elsewhere classified  Scar condition and fibrosis of skin  Stiffness of right wrist, not elsewhere classified  Pain in right wrist    Problem List There are no problems to display for this patient.   Oletta Cohn, OTR/L,CLT 11/14/2022, 4:46 PM  [facility] [address] [place], [address] Phone: [phone]   Fax:  [phone]  Name: Austin Blankenship MRN: [id number] Date of Birth: [birth year]

## 2022-11-18 ENCOUNTER — Ambulatory Visit: Payer: 59 | Admitting: Occupational Therapy

## 2022-11-18 DIAGNOSIS — M6281 Muscle weakness (generalized): Secondary | ICD-10-CM | POA: Diagnosis not present

## 2022-11-18 DIAGNOSIS — L905 Scar conditions and fibrosis of skin: Secondary | ICD-10-CM

## 2022-11-18 DIAGNOSIS — M25531 Pain in right wrist: Secondary | ICD-10-CM

## 2022-11-18 DIAGNOSIS — M79644 Pain in right finger(s): Secondary | ICD-10-CM

## 2022-11-18 DIAGNOSIS — M25641 Stiffness of right hand, not elsewhere classified: Secondary | ICD-10-CM

## 2022-11-18 DIAGNOSIS — M25631 Stiffness of right wrist, not elsewhere classified: Secondary | ICD-10-CM

## 2022-11-18 NOTE — Therapy (Signed)
[facility] [address] [place], [address] Phone: [phone]   Fax:  [phone]  Occupational Therapy Treatment  Patient Details  Name: BODIE ABERNETHY MRN: [id number] Date of Birth: [birth year] Referring Provider (OT): [place] PA   Encounter Date: 11/18/2022   OT End of Session - 11/18/22 1502     Visit Number 27    Number of Visits 29    Date for OT Re-Evaluation 12/05/22    OT Start Time 1503    OT Stop Time 1554    OT Time Calculation (min) 51 min    Activity Tolerance Patient tolerated treatment well;No increased pain    Behavior During Therapy [facility] for tasks assessed/performed             Past Medical History:  Diagnosis Date   Medical history non-contributory     Past Surgical History:  Procedure Laterality Date   CARPAL TUNNEL RELEASE Right 06/28/2022   Procedure: CARPAL TUNNEL RELEASE;  Surgeon: [name], MD;  Location: [facility];  Service: Orthopedics;  Laterality: Right;   FRACTURE SURGERY     left leg.  age 10 or 27   HARDWARE REMOVAL Right 10/01/2022   Procedure: Right wrist dorsal plate removal;  Surgeon: [name], MD;  Location: [facility];  Service: Orthopedics;  Laterality: Right;  EXPLANTED 1 VOLAR PLATE AND 5 PEGS FROM RIGHT WRIST BY DR. Rosita Kea.   OPEN REDUCTION INTERNAL FIXATION (ORIF) DISTAL RADIAL FRACTURE Right 06/28/2022   Procedure: OPEN REDUCTION INTERNAL FIXATION (ORIF) DISTAL RADIUS FRACTURE;  Surgeon: [name], MD;  Location: [facility];  Service: Orthopedics;  Laterality: Right;   TONSILLECTOMY     and Adenoids as child    There were no vitals filed for this visit.   Subjective Assessment - 11/18/22 1502     Subjective  My daugher is in the hospital - so I did not do a lot this past weekend - fisting feel tight and bending the wrist - picked up my 2 yrs old son- had to use my forearm    Pertinent History 07/12/22 ORTHo NOTE- Senan Urey is a 37 y.o. male who presents today for suture removal status post a open reduction internal fixation of right distal radius fracture in addition to a carpal tunnel release. The patient suffered a right distal radius fracture and underwent surgery with Dr. Rosita Kea on 06/28/2022. The patient had a complex distal radius fracture which did require use of both the volar and dorsal plate. The patient also suffered acute carpal tunnel at the time the injury and did have to undergo a carpal tunnel release. The patient was evaluated for skin check and was placed in a Velcro wrist splint, he was instructed to begin using his hand and flexing and extending his fingers. The patient presents today with still moderate swelling throughout the right hand, he does still report a burning in the median nerve distribution but does feel that the sensation is improving. He has been working on trying to make a full fist. He has been nonweightbearing to the right upper extremity. The patient is taking hydrocodone as needed for discomfort. Pain score today is a 7 out of 10. 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 Yes    Pain Score 1     Pain Location Wrist    Pain Orientation Right    Pain Descriptors / Indicators Tightness                [facility] OT Assessment - 11/18/22 0001       AROM   Right Forearm Supination 85 Degrees    Right Wrist Extension 60 Degrees    Right Wrist Flexion 65 Degrees                      OT Treatments/Exercises (OP) - 11/18/22 0001       RUE Paraffin   Number Minutes Paraffin 8 Minutes    RUE Paraffin Location Wrist   hand   Comments intrinsic fist nad wrist flexion             PROM and stretches by OT for wrist flexion open and close fist -  10 reps hold 10 sec Wrist extention on table and armrest - stretches Wrist ext 70 and flexion open hand 75 , close fist less    BTE sup 2 lbs 120 sec BTE gripper 45 and then decrease to 35 lbs - 120 sec Writs ext large screwdriver - 120 sec- pain free range 2 lbs   2 kg ball on flex web- sup- roll L<> R  1 min  Bouncing 1 kg ball on web in supination  1 min  In between catch and throw 1 and 2 kg ball  with R and catch with R -  Benik neoprene on   2 min each    5 lbs for elbow flexion wrist neutral, scapula retraction , shoulder ext and punches over head - wrist neutral  12 reps pain free  For conditioning - add to HEP    Continue at home gripping pulling with ulnar hand dark blue putty 20 reps Lat and 3 point pinch into short sausage putty - 2 reps  X 6  12 reps 2 x day    Able to carry 10 lbs - but lift in neutral about 8 lbs - and palm down 4 lbs             OT Education - 11/18/22 1502     Education Details progress and HEP changes    Person(s) Educated Patient    Methods Explanation;Demonstration;Tactile cues;Verbal cues;Handout    Comprehension Verbal cues required;Returned demonstration;Verbalized understanding              OT Short Term Goals - 10/24/22 1033       OT SHORT TERM GOAL #1   Title Patient to be independent in home program to decrease edema and increase motion for pain and to be able to lay fingers and wrist straight on table.    Status Achieved               OT Long Term Goals - 10/24/22 1033       OT LONG TERM GOAL #1   Title Digit flexion increased for patient to touch palm without increased symptoms to initiate using it in ADLs    Status Achieved      OT LONG TERM GOAL #2   Title Right wrist active range of motion increased to within functional limits for patient to use right hand in  50% of ADLs    Baseline Right wrist extension 5 degrees and  flexion 30 degrees, ulnar deviation 5 degrees and radial deviation 12-not using the right hand or wrist activities7/15/24:  Pt using right hand and wrist in activities but still working on increasing active ROM NOW plate remove dorsally 10/01/22 decrease AROM in all planes for wrist and forearm.  10/22/22:Right wrist extension 38 degrees, flex 50    Time 6    Period Weeks    Status On-going    Target Date 11/21/22      OT LONG TERM GOAL #3   Title Right forearm supination pronation increased to within functional limits for patient to turn doorknob with minimal symptoms    Baseline Patient decreased greatly in supination 50 and pronation 70, 09/02/22:  continued improvement in sup/pro but still working on end ranges, sup 75, pro 90 degrees NOW sup 75 in session 85-.  10/22/22:  80 degrees supination    Time 4    Period Weeks    Status On-going    Target Date 11/07/22      OT LONG TERM GOAL #4   Title Right digit extension as well as thumb palmar radial abduction within functional limits to put hand in pocket and don gloves with no issues    Status Achieved      OT LONG TERM GOAL #5   Title Strength in right hand and wrist for patient to be able to carry 5 pounds with no increase symptoms, push and pull heavy door and initiate using tools    Baseline Pt had dorsal plate removed 1/91/47 bandage in place - stitches coming out 10/14/22 - some increase stiffness in right hand and wrist- decrease strength in right hand,  and wrist.  10/22/22:  continues to work on stiffness and functional use    Time 8    Period Weeks    Status On-going    Target Date 12/05/22                   Plan - 11/18/22 1502     Clinical Impression Statement Patient presented OT evaluation with diagnosis of s/p  right distal radius fracture with volar and dorsal plate, and carpal tunnel release by Dr. Rosita Kea 06/28/2022.  Pt about 4 1/2 months s /p - Pt had dorsal plate removed 10/17/54 and return with v/o from Dr Rosita Kea to  cont OT - to increase ROM - no restrictions.  Pt report  he no longer feels a block with wrist movement.  Encourage and remind pt to cont to focus on wrist flexion, ext  stretches.  In session after paraffin and stretches improve greatly -and sup 85-90. Able to focus on strength more and grip/prehension strength. Cont to be limited by scar tissue - and stiffness in flexion and extention.    Last week pt can carry 8 lbs -and lift palm down 4 lbs - feel pull . Done this date BTE for grip , wrist ext and supination strengthening - thro and catch on rebounder 1 and 2 kg ball - and elbow and shoulder with wrist in neutral 5 lbs exercises- painfree . Pt show inrease functional use of R hand -   Pt limited in functional use of right hand and wrist in ADLs and IADLs. Patient can benefit from skilled OT services to decrease scar tissue, and pain and increase motion and strength to return to prior level of function.    OT Occupational Profile and History  Problem Focused Assessment - Including review of records relating to presenting problem    Occupational performance deficits (Please refer to evaluation for details): ADL's;IADL's;Work;Play;Social Participation;Leisure    Body Structure / Function / Physical Skills ADL;Decreased knowledge of precautions;Flexibility;ROM;UE functional use;Scar mobility;FMC;Dexterity;Sensation;Strength;Pain;IADL;Edema;Coordination    Rehab Potential Fair    Clinical Decision Making Limited treatment options, no task modification necessary    Comorbidities Affecting Occupational Performance: None    Modification or Assistance to Complete Evaluation  No modification of tasks or assist necessary to complete eval    OT Frequency 2x / week    OT Duration 8 weeks    OT Treatment/Interventions Self-care/ADL training;Therapeutic exercise;Patient/family education;Splinting;Paraffin;Fluidtherapy;Contrast Bath;DME and/or AE instruction;Manual Therapy;Passive range of motion;Scar  mobilization;Therapeutic activities    Consulted and Agree with Plan of Care Patient             Patient will benefit from skilled therapeutic intervention in order to improve the following deficits and impairments:   Body Structure / Function / Physical Skills: ADL, Decreased knowledge of precautions, Flexibility, ROM, UE functional use, Scar mobility, FMC, Dexterity, Sensation, Strength, Pain, IADL, Edema, Coordination       Visit Diagnosis: Muscle weakness (generalized)  Stiffness of right hand, not elsewhere classified  Scar condition and fibrosis of skin  Stiffness of right wrist, not elsewhere classified  Pain in right wrist  Pain in right finger(s)    Problem List There are no problems to display for this patient.   Oletta Cohn, OTR/L,CLT 11/18/2022, 6:11 PM  [facility] [address] [place], [address] Phone: [phone]   Fax:  [phone]  Name: FORTINO HAAG MRN: [id number] Date of Birth: May 28, 1985

## 2022-11-25 ENCOUNTER — Ambulatory Visit: Payer: 59 | Attending: Student | Admitting: Occupational Therapy

## 2022-11-25 DIAGNOSIS — L905 Scar conditions and fibrosis of skin: Secondary | ICD-10-CM | POA: Insufficient documentation

## 2022-11-25 DIAGNOSIS — M25531 Pain in right wrist: Secondary | ICD-10-CM | POA: Diagnosis present

## 2022-11-25 DIAGNOSIS — M25631 Stiffness of right wrist, not elsewhere classified: Secondary | ICD-10-CM | POA: Insufficient documentation

## 2022-11-25 DIAGNOSIS — M25641 Stiffness of right hand, not elsewhere classified: Secondary | ICD-10-CM | POA: Insufficient documentation

## 2022-11-25 DIAGNOSIS — M6281 Muscle weakness (generalized): Secondary | ICD-10-CM | POA: Insufficient documentation

## 2022-11-25 NOTE — Therapy (Signed)
[facility] [address], [place], [address] Phone: [phone]   Fax:  [phone]  Occupational Therapy Treatment  Patient Details  Name: Austin Blankenship MRN: [id number] Date of Birth: [birth year] Referring Provider (OT): [place] PA   Encounter Date: 11/25/2022   OT End of Session - 11/25/22 1410     Visit Number 28    Number of Visits 29    Date for OT Re-Evaluation 12/05/22    OT Start Time 1345    OT Stop Time 1430    OT Time Calculation (min) 45 min    Activity Tolerance Patient tolerated treatment well;No increased pain    Behavior During Therapy [facility] for tasks assessed/performed             Past Medical History:  Diagnosis Date   Medical history non-contributory     Past Surgical History:  Procedure Laterality Date   CARPAL TUNNEL RELEASE Right 06/28/2022   Procedure: CARPAL TUNNEL RELEASE;  Surgeon: [name], MD;  Location: [facility];  Service: Orthopedics;  Laterality: Right;   FRACTURE SURGERY     left leg.  age 53 or 51   HARDWARE REMOVAL Right 10/01/2022   Procedure: Right wrist dorsal plate removal;  Surgeon: [name], MD;  Location: [facility];  Service: Orthopedics;  Laterality: Right;  EXPLANTED 1 VOLAR PLATE AND 5 PEGS FROM RIGHT WRIST BY DR. Rosita Kea.   OPEN REDUCTION INTERNAL FIXATION (ORIF) DISTAL RADIAL FRACTURE Right 06/28/2022   Procedure: OPEN REDUCTION INTERNAL FIXATION (ORIF) DISTAL RADIUS FRACTURE;  Surgeon: [name], MD;  Location: [facility];  Service: Orthopedics;  Laterality: Right;   TONSILLECTOMY     and Adenoids as child    There were no vitals filed for this visit.   Subjective Assessment - 11/25/22 1410     Subjective  I can pick up some heavier things - pushing and pulling - pushed wheel barrow this weekend with car parts on it    Pertinent History 07/12/22 ORTHo NOTE- Austin Blankenship is a 37 y.o. male who presents today for  suture removal status post a open reduction internal fixation of right distal radius fracture in addition to a carpal tunnel release. The patient suffered a right distal radius fracture and underwent surgery with Dr. Rosita Kea on 06/28/2022. The patient had a complex distal radius fracture which did require use of both the volar and dorsal plate. The patient also suffered acute carpal tunnel at the time the injury and did have to undergo a carpal tunnel release. The patient was evaluated for skin check and was placed in a Velcro wrist splint, he was instructed to begin using his hand and flexing and extending his fingers. The patient presents today with still moderate swelling throughout the right hand, he does still report a burning in the median nerve distribution but does feel that the sensation is improving. He has been working on trying to make a full fist. He has been nonweightbearing to the right upper extremity. The patient is taking hydrocodone as needed for discomfort. Pain score today is a 7 out of 10. 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 No/denies                [facility] OT Assessment - 11/25/22 0001       AROM   Right Forearm Supination 85 Degrees    Right Wrist Extension 70 Degrees    Right Wrist Flexion 65 Degrees   75 in session     Strength   Right Hand Grip (lbs) 46    Right Hand Lateral Pinch 24 lbs    Right Hand 3 Point Pinch 21 lbs    Left Hand Grip (lbs) 100    Left Hand Lateral Pinch 27 lbs    Left Hand 3 Point Pinch 23 lbs              Review with pt again gripping pulling with ulnar  side of hand dark blue putty 20 reps Followed but horizontal wrist ext - 20 reps dark  blue putty  Pt have increase motion for composite flexion now  Lat and 3 point pinch into dark blue firm  putty - 15 reps  Also can do twisting and pulling like taffy  2 x day   Wall push up - - rotate forearm elbow volar and medial - 12 reps   And on table weight shifting - wrist extention stretch with elbow volar and medial  12 reps          OT Treatments/Exercises (OP) - 11/25/22 0001       RUE Paraffin   Number Minutes Paraffin 8 Minutes    RUE Paraffin Location Wrist    Comments flexion wrist stretch - 3 x 2 min            PROM and stretches by OT for wrist flexion open and close fist -  10 reps hold 10 sec Progress well     BTE sup 3  lbs 120 sec BTE gripper 40  - 120 sec was last time 35 lbs      2 kg ball  with bilateral hands throw  and catch -   2 min each          Able to carry 10 lbs - but lift in neutral about 8 lbs - and palm down 6 lbs   Was last time 4 lbs                   OT Education - 11/25/22 1430     Education Details progress and HEP changes    Person(s) Educated Patient    Methods Explanation;Demonstration;Tactile cues;Verbal cues;Handout    Comprehension Verbal cues required;Returned demonstration;Verbalized understanding              OT Short Term Goals - 10/24/22 1033       OT SHORT TERM GOAL #1   Title Patient to be independent in home program to decrease edema and increase motion for pain and to be able to lay fingers and wrist straight on table.    Status Achieved               OT Long Term Goals - 10/24/22 1033       OT LONG TERM GOAL #1   Title Digit flexion increased for patient to touch palm without increased symptoms to initiate using it in ADLs    Status Achieved      OT LONG TERM GOAL #2   Title Right wrist active range of motion increased to within  functional limits for patient to use right hand in 50% of ADLs    Baseline Right wrist extension 5 degrees and flexion 30 degrees, ulnar  deviation 5 degrees and radial deviation 12-not using the right hand or wrist activities7/15/24:  Pt using right hand and wrist in activities but still working on increasing active ROM NOW plate remove dorsally 10/01/22 decrease AROM in all planes for wrist and forearm.  10/22/22:Right wrist extension 38 degrees, flex 50    Time 6    Period Weeks    Status On-going    Target Date 11/21/22      OT LONG TERM GOAL #3   Title Right forearm supination pronation increased to within functional limits for patient to turn doorknob with minimal symptoms    Baseline Patient decreased greatly in supination 50 and pronation 70, 09/02/22:  continued improvement in sup/pro but still working on end ranges, sup 75, pro 90 degrees NOW sup 75 in session 85-.  10/22/22:  80 degrees supination    Time 4    Period Weeks    Status On-going    Target Date 11/07/22      OT LONG TERM GOAL #4   Title Right digit extension as well as thumb palmar radial abduction within functional limits to put hand in pocket and don gloves with no issues    Status Achieved      OT LONG TERM GOAL #5   Title Strength in right hand and wrist for patient to be able to carry 5 pounds with no increase symptoms, push and pull heavy door and initiate using tools    Baseline Pt had dorsal plate removed 0/86/57 bandage in place - stitches coming out 10/14/22 - some increase stiffness in right hand and wrist- decrease strength in right hand,  and wrist.  10/22/22:  continues to work on stiffness and functional use    Time 8    Period Weeks    Status On-going    Target Date 12/05/22                   Plan - 11/25/22 1431     Clinical Impression Statement Patient presented OT evaluation with diagnosis of s/p  right distal radius fracture with volar and dorsal plate, and carpal tunnel release by Dr. Rosita Kea 06/28/2022.  Pt about 4 1/2 months s /p - Pt had dorsal plate removed 8/46/96 and return with v/o from Dr Rosita Kea to cont OT - to increase ROM -  no restrictions.  Pt report  he no longer feels a block with wrist movement.  Encourage and remind pt to cont to focus on wrist flexion, ext  stretches.  In session after paraffin and stretches improve greatly -and sup 85-90. Able to focus on strength more and grip/prehension strength. Cont to be limited by scar tissue - and stiffness in flexion and extention.    THis week pt can carry 10 lbs , lift neutral 8 lbs and palm down 6 lbs. Done this date BTE for grip , wrist  supination strengthening - thro and catch on rebounder 2 kg ball- Focus and review dark firm putty for gripping, pulling , pinches- wall and table weigth shifting -  Pt show inrease functional use of R hand -   Pt limited in functional use of right hand and wrist in ADLs and IADLs. Patient can benefit from skilled OT services to decrease scar tissue, and pain and increase motion and strength to return to prior level  of function.    OT Occupational Profile and History Problem Focused Assessment - Including review of records relating to presenting problem    Occupational performance deficits (Please refer to evaluation for details): ADL's;IADL's;Work;Play;Social Participation;Leisure    Body Structure / Function / Physical Skills ADL;Decreased knowledge of precautions;Flexibility;ROM;UE functional use;Scar mobility;FMC;Dexterity;Sensation;Strength;Pain;IADL;Edema;Coordination    Rehab Potential Fair    Clinical Decision Making Limited treatment options, no task modification necessary    Comorbidities Affecting Occupational Performance: None    Modification or Assistance to Complete Evaluation  No modification of tasks or assist necessary to complete eval    OT Frequency 2x / week    OT Duration 8 weeks    OT Treatment/Interventions Self-care/ADL training;Therapeutic exercise;Patient/family education;Splinting;Paraffin;Fluidtherapy;Contrast Bath;DME and/or AE instruction;Manual Therapy;Passive range of motion;Scar mobilization;Therapeutic  activities    Consulted and Agree with Plan of Care Patient             Patient will benefit from skilled therapeutic intervention in order to improve the following deficits and impairments:   Body Structure / Function / Physical Skills: ADL, Decreased knowledge of precautions, Flexibility, ROM, UE functional use, Scar mobility, FMC, Dexterity, Sensation, Strength, Pain, IADL, Edema, Coordination       Visit Diagnosis: Muscle weakness (generalized)  Stiffness of right hand, not elsewhere classified  Scar condition and fibrosis of skin  Stiffness of right wrist, not elsewhere classified  Pain in right wrist    Problem List There are no problems to display for this patient.   Oletta Cohn, OTR/L,CLT 11/25/2022, 4:46 PM  [facility] [address] [place], [address] Phone: [phone]   Fax:  [phone]  Name: ARGENIS KUMARI MRN: [id number] Date of Birth: [birth year]

## 2022-11-28 ENCOUNTER — Ambulatory Visit: Payer: 59 | Admitting: Occupational Therapy

## 2022-12-02 ENCOUNTER — Ambulatory Visit: Payer: 59 | Admitting: Occupational Therapy

## 2022-12-02 DIAGNOSIS — M25641 Stiffness of right hand, not elsewhere classified: Secondary | ICD-10-CM

## 2022-12-02 DIAGNOSIS — M6281 Muscle weakness (generalized): Secondary | ICD-10-CM

## 2022-12-02 DIAGNOSIS — M25531 Pain in right wrist: Secondary | ICD-10-CM

## 2022-12-02 DIAGNOSIS — M25631 Stiffness of right wrist, not elsewhere classified: Secondary | ICD-10-CM

## 2022-12-02 DIAGNOSIS — L905 Scar conditions and fibrosis of skin: Secondary | ICD-10-CM

## 2022-12-02 NOTE — Therapy (Signed)
[facility] [address], [place], [address] Phone: [phone]   Fax:  [phone]  Occupational Therapy Treatment  Patient Details  Name: Austin Blankenship MRN: [id number] Date of Birth: [birth year] Referring Provider (OT): [place] PA   Encounter Date: 12/02/2022   OT End of Session - 12/02/22 1600     Visit Number 29    Number of Visits 29    Date for OT Re-Evaluation 12/05/22    OT Start Time 1117    OT Stop Time 1200    OT Time Calculation (min) 43 min    Activity Tolerance Patient tolerated treatment well;No increased pain    Behavior During Therapy [facility] for tasks assessed/performed             Past Medical History:  Diagnosis Date   Medical history non-contributory     Past Surgical History:  Procedure Laterality Date   CARPAL TUNNEL RELEASE Right 06/28/2022   Procedure: CARPAL TUNNEL RELEASE;  Surgeon: [name], MD;  Location: [facility];  Service: Orthopedics;  Laterality: Right;   FRACTURE SURGERY     left leg.  age 19 or 26   HARDWARE REMOVAL Right 10/01/2022   Procedure: Right wrist dorsal plate removal;  Surgeon: [name], MD;  Location: [facility];  Service: Orthopedics;  Laterality: Right;  EXPLANTED 1 VOLAR PLATE AND 5 PEGS FROM RIGHT WRIST BY DR. Rosita Kea.   OPEN REDUCTION INTERNAL FIXATION (ORIF) DISTAL RADIAL FRACTURE Right 06/28/2022   Procedure: OPEN REDUCTION INTERNAL FIXATION (ORIF) DISTAL RADIUS FRACTURE;  Surgeon: [name], MD;  Location: [facility];  Service: Orthopedics;  Laterality: Right;   TONSILLECTOMY     and Adenoids as child    There were no vitals filed for this visit.   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 male who presents  today for suture removal status post a open reduction internal fixation of right distal radius fracture in addition to a carpal tunnel release. The patient suffered a right distal radius fracture and underwent surgery with Dr. Rosita Kea on 06/28/2022. The patient had a complex distal radius fracture which did require use of both the volar and dorsal plate. The patient also suffered acute carpal tunnel at the time the injury and did have to undergo a carpal tunnel release. The patient was evaluated for skin check and was placed in a Velcro wrist splint, he was instructed to begin using his hand and flexing and extending his fingers. The patient presents today with still moderate swelling throughout the right hand, he does still report a burning in the median nerve distribution but does feel that the sensation is improving. He has been working on trying to make a full fist. He has been nonweightbearing to the right upper extremity. The patient is taking hydrocodone as needed for discomfort. Pain score today is a 7 out of 10. 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 and chest press 25 pounds pain-free 12 reps Elbow extension 15 pounds bilateral 12 reps Rolling one-handed 5 to 10 pounds pain-free right hand Chest press bilateral with trunk rotation 10 pounds bilaterally 12 reps each side  Weighted ball thrown in the floor bilaterally picking up 8 pounds 6 pounds catch and throw bilaterally on rebounder pain-free 5 pounds with elbow flexion wrist extension press over 12 reps Patient can start at [employer] working on some of these workouts at home for conditioning keeping it pain-free  Follow  up next week            OT Treatments/Exercises (OP) - 12/02/22 0001       RUE Paraffin   Number Minutes Paraffin 8 Minutes    RUE Paraffin Location Wrist    Comments end of session decrease stiffness - ext , sup and flexion of wrist                    OT Education - 12/02/22 1600     Education Details upgrade in HEP    Person(s) Educated Patient    Methods Explanation;Demonstration;Tactile cues;Verbal cues;Handout    Comprehension Verbal cues required;Returned demonstration;Verbalized understanding              OT Short Term Goals - 10/24/22 1033       OT SHORT TERM GOAL #1   Title Patient to be independent in home program to decrease edema and increase motion for pain and to be able to lay fingers and wrist straight on table.    Status Achieved               OT Long Term Goals - 10/24/22 1033       OT LONG TERM GOAL #1   Title Digit flexion increased for patient to touch palm without increased symptoms to initiate using it in ADLs    Status Achieved      OT LONG TERM GOAL #2   Title Right wrist active range of motion increased to within functional limits for patient to use right hand in 50% of ADLs    Baseline Right wrist extension 5 degrees and flexion 30 degrees, ulnar deviation 5 degrees and radial deviation 12-not using the right hand or wrist activities7/15/24:  Pt using right hand and wrist  in activities but still working on increasing active ROM NOW plate remove dorsally 10/01/22 decrease AROM in all planes for wrist and forearm.  10/22/22:Right wrist extension 38 degrees, flex 50    Time 6    Period Weeks    Status On-going    Target Date 11/21/22      OT LONG  TERM GOAL #3   Title Right forearm supination pronation increased to within functional limits for patient to turn doorknob with minimal symptoms    Baseline Patient decreased greatly in supination 50 and pronation 70, 09/02/22:  continued improvement in sup/pro but still working on end ranges, sup 75, pro 90 degrees NOW sup 75 in session 85-.  10/22/22:  80 degrees supination    Time 4    Period Weeks    Status On-going    Target Date 11/07/22      OT LONG TERM GOAL #4   Title Right digit extension as well as thumb palmar radial abduction within functional limits to put hand in pocket and don gloves with no issues    Status Achieved      OT LONG TERM GOAL #5   Title Strength in right hand and wrist for patient to be able to carry 5 pounds with no increase symptoms, push and pull heavy door and initiate using tools    Baseline Pt had dorsal plate removed 1/61/09 bandage in place - stitches coming out 10/14/22 - some increase stiffness in right hand and wrist- decrease strength in right hand,  and wrist.  10/22/22:  continues to work on stiffness and functional use    Time 8    Period Weeks    Status On-going    Target Date 12/05/22                   Plan - 12/02/22 1602     Clinical Impression Statement Patient presented OT evaluation with diagnosis of s/p  right distal radius fracture with volar and dorsal plate, and carpal tunnel release by Dr. Rosita Kea 06/28/2022.  Pt about 4 1/2 months s /p - Pt had dorsal plate removed 07/22/52 and return with v/o from Dr Rosita Kea to cont OT - to increase ROM - no restrictions.  Pt report  he no longer feels a block with wrist movement.  Encourage and remind pt to cont to focus on wrist  flexion, ext  stretches.  In session focus on strengthening doing gym, weight activities- pt able to do scapula retraction and chest press 25 lbs on [employer] , weighted ball 8 lbs throwe and pick up - 15 lbs elbow ext bilateral, catch and throw 7 lbs ball on rebounder - and pull one hand with R biodex 5 lbs for rows- keeping it pain free- pt report he has member ship to [employer] - pt to start working out pain free- keep weight same as today and follow up in week. Pt to cont with dark firm putty for gripping, pulling , pinches- wall and table weigth shifting for wrist ext  -  Pt show inrease functional use of R hand -   Pt limited in functional use of right hand and wrist in ADLs and IADLs. Patient can benefit from skilled OT services to decrease scar tissue, and pain and increase motion and strength to return to prior level of function.    OT Occupational Profile and History Problem Focused Assessment - Including review of records relating to presenting problem    Occupational performance deficits (Please refer to evaluation for details): ADL's;IADL's;Work;Play;Social Participation;Leisure    Body Structure / Function / Physical Skills ADL;Decreased knowledge of precautions;Flexibility;ROM;UE functional use;Scar mobility;FMC;Dexterity;Sensation;Strength;Pain;IADL;Edema;Coordination    Rehab Potential Fair    Clinical Decision Making Limited treatment options, no task modification necessary    Comorbidities Affecting Occupational Performance: None    Modification or Assistance to Complete  Evaluation  No modification of tasks or assist necessary to complete eval    OT Frequency 1x / week    OT Duration 2 weeks    OT Treatment/Interventions Self-care/ADL training;Therapeutic exercise;Patient/family education;Splinting;Paraffin;Fluidtherapy;Contrast Bath;DME and/or AE instruction;Manual Therapy;Passive range of motion;Scar mobilization;Therapeutic activities    Consulted and Agree with Plan of Care  Patient             Patient will benefit from skilled therapeutic intervention in order to improve the following deficits and impairments:   Body Structure / Function / Physical Skills: ADL, Decreased knowledge of precautions, Flexibility, ROM, UE functional use, Scar mobility, FMC, Dexterity, Sensation, Strength, Pain, IADL, Edema, Coordination       Visit Diagnosis: Muscle weakness (generalized)  Stiffness of right hand, not elsewhere classified  Stiffness of right wrist, not elsewhere classified  Scar condition and fibrosis of skin  Pain in right wrist    Problem List There are no problems to display for this patient.   Oletta Cohn, OTR/L,CLT 12/02/2022, 4:06 PM  [place]  [facility] [address], [place], [address] Phone: [phone]   Fax:  [phone]  Name: Austin Blankenship MRN: [id number] Date of Birth: 1985-04-05

## 2022-12-10 ENCOUNTER — Ambulatory Visit: Payer: 59 | Admitting: Occupational Therapy

## 2022-12-10 DIAGNOSIS — M6281 Muscle weakness (generalized): Secondary | ICD-10-CM

## 2022-12-10 DIAGNOSIS — M25641 Stiffness of right hand, not elsewhere classified: Secondary | ICD-10-CM

## 2022-12-10 DIAGNOSIS — L905 Scar conditions and fibrosis of skin: Secondary | ICD-10-CM

## 2022-12-10 DIAGNOSIS — M25631 Stiffness of right wrist, not elsewhere classified: Secondary | ICD-10-CM

## 2022-12-10 NOTE — Therapy (Signed)
[facility] [address] [place], [address] Phone: [phone]   Fax:  [phone]  Occupational Therapy Treatment  Patient Details  Name: Austin Blankenship MRN: [id number] Date of Birth: 1985/11/27 Referring Provider (OT): Crenshaw PA   Encounter Date: 12/10/2022   OT End of Session - 12/10/22 2013     Visit Number 30    Number of Visits 35    Date for OT Re-Evaluation 02/04/23    OT Start Time 1126    OT Stop Time 1200    OT Time Calculation (min) 34 min    Activity Tolerance Patient tolerated treatment well    Behavior During Therapy [facility] for tasks assessed/performed             Past Medical History:  Diagnosis Date   Medical history non-contributory     Past Surgical History:  Procedure Laterality Date   CARPAL TUNNEL RELEASE Right 06/28/2022   Procedure: CARPAL TUNNEL RELEASE;  Surgeon: Austin Bucker, MD;  Location: [facility];  Service: Orthopedics;  Laterality: Right;   FRACTURE SURGERY     left leg.  age 89 or 49   HARDWARE REMOVAL Right 10/01/2022   Procedure: Right wrist dorsal plate removal;  Surgeon: Austin Bucker, MD;  Location: [facility];  Service: Orthopedics;  Laterality: Right;  EXPLANTED 1 VOLAR PLATE AND 5 PEGS FROM RIGHT WRIST BY DR. Rosita Blankenship.   OPEN REDUCTION INTERNAL FIXATION (ORIF) DISTAL RADIAL FRACTURE Right 06/28/2022   Procedure: OPEN REDUCTION INTERNAL FIXATION (ORIF) DISTAL RADIUS FRACTURE;  Surgeon: Austin Bucker, MD;  Location: [facility];  Service: Orthopedics;  Laterality: Right;   TONSILLECTOMY     and Adenoids as child    There were no vitals filed for this visit.   Subjective Assessment - 12/10/22 2012     Subjective  I did not get to the gym.  But have been doing the 3 pound for the wrist and is better and stronger.  I am doing the rotation and I am working on the flexibility.  I still cannot pick up a gallon of milk and poor    Pertinent  History 07/12/22 ORTHo NOTE- Austin Blankenship is a 37 y.o. male who presents today for suture removal status post a open reduction internal fixation of right distal radius fracture in addition to a carpal tunnel release. The patient suffered a right distal radius fracture and underwent surgery with Dr. Rosita Blankenship on 06/28/2022. The patient had a complex distal radius fracture which did require use of both the volar and dorsal plate. The patient also suffered acute carpal tunnel at the time the injury and did have to undergo a carpal tunnel release. The patient was evaluated for skin check and was placed in a Velcro wrist splint, he was instructed to begin using his hand and flexing and extending his fingers. The patient presents today with still moderate swelling throughout the right hand, he does still report a burning in the median nerve distribution but does feel that the sensation is improving. He has been working on trying to make a full fist. He has been nonweightbearing to the right upper extremity. The patient is taking hydrocodone as needed for discomfort. Pain score today is a 7 out of 10. 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 Yes    Pain Score 1     Pain Location Wrist    Pain Orientation Right    Pain Descriptors / Indicators Tightness;Burning    Pain Type Surgical pain             Grip R 46 and supported 65 lbs , L 100 lbs        cont gripping pulling with ulnar  side of hand dark blue putty 20 reps Followed but horizontal wrist ext - 20 reps dark blue putty  Pt have increase motion for composite flexion now  Lat and 3 point pinch into dark blue firm  putty - 15 reps  Also can do twisting  and pulling like taffy  2 x day    Wall push up - - rotate forearm elbow volar and medial - 12 reps   Cont table weight shifting - wrist extention stretch with elbow volar and medial  12 reps  Able to do 60 ext and flexion 70  Cont end range PROM wrist flexion, sup and pronation  During day  Scar massage       Reviewed with patient working out on [employer] (medical) scapular retraction  65 lbs and chest press 45 pounds pain-free 12 reps this date- last week was 25 lbs   Chest press bilateral with trunk rotation - pull L and R hand unilateral 10 pounds - chest press bilaterally 12 reps each side   Weighted ball thrown in the floor bilaterally picking up 15 pounds  10 lbs kettlebell - pick up over head R and then 20 lbs to chest bilateral  5 lbs for wrist extention  Symptoms free Roll 8 lbs weight ball sup /pro - palm to palm   Encourage pt again  can start at [employer] working on some of these workouts at home for conditioning keeping it pain-free              OT Education - 12/10/22 2013     Education Details upgrade in HEP    Methods Explanation;Demonstration;Tactile cues;Verbal cues;Handout    Comprehension Verbal cues required;Returned demonstration;Verbalized understanding              OT Short Term Goals - 10/24/22 1033       OT SHORT TERM GOAL #1   Title Patient to be independent in home program to decrease edema and increase motion for pain and to be able to lay fingers and wrist straight on table.    Status Achieved               OT Long Term Goals - 12/10/22 2021       OT LONG TERM GOAL #1   Title Digit flexion increased for patient to touch palm without increased symptoms to initiate using it in ADLs    Status Achieved      OT LONG TERM GOAL #2   Title Right wrist active range of motion increased to within functional limits for patient to use right hand in 50% of ADLs    Status Achieved      OT LONG TERM GOAL #3   Title Right  forearm supination pronation increased to within functional limits for patient to turn doorknob with minimal symptoms    Status Achieved  OT LONG TERM GOAL #4   Title Right digit extension as well as thumb palmar radial abduction within functional limits to put hand in pocket and don gloves with no issues    Status Achieved      OT LONG TERM GOAL #5   Title Strength in right hand and wrist for patient to be able to carry 5 pounds with no increase symptoms, push and pull heavy door and initiate using tools    Status Achieved      Long Term Additional Goals   Additional Long Term Goals Yes      OT LONG TERM GOAL #6   Title Pt to show independence and carryover with home program progression at home and gym for patient to be able to use hands symptom-free and his work activities repairing cars as well as yard work and around the house    Baseline Patient still with some discomfort past 85 degrees of pronation and supination, as well as ulnar deviation and past 1670 degrees of wrist extension and flexion.  Patient fearful to progress at home and gym increasing weight.  OT initiated last 2 sessions more conditioning with weights in gym equipment.    Time 8    Period Weeks    Status New    Target Date 02/04/23                   Plan - 12/10/22 2014     Clinical Impression Statement Patient  followed by OT  for s/p  right distal radius fracture with volar and dorsal plate, and carpal tunnel release by Dr. Rosita Blankenship 06/28/2022.  Pt had dorsal plate removed 8/65/78 and return with v/o from Dr Austin Blankenship to cont OT - to increase ROM - no restrictions.  Pt report  he no longer feels a block with wrist movement. Pt made great progress in scar tissue, pain , motion and strenght from [facility] - but cont to have scar tissue and decrease wrist ext , flexion past 60 ext and 70 flexion- and 85 degrees of  sup and pronation. Pt reminded again to cont to focus daily on wrist flexion, ext  stretches -and sup /pro  end range- this date pt was able to do strengthening doing gym, weight activities- pt able to do scapula retraction 65 lbs and chest press 45 lbs-  on Biodex , weighted ball 15 lbs throw and pick up bilateral - pull one hand with R biodex 10 lbs for chest press- and cow bell R hand 10 lbs and over head- and bilateral to chest 20 lbs -keeping it pain free- pt report he has member ship to [employer] - pt to start working out pain free- keep weight same as today and follow up in 10-14 days.  Pt to cont with dark firm putty for gripping, pulling , pinches- wall and table weigth shifting for wrist ext  -  Pt show inrease functional use of R hand -   Pt limited in functional use of right hand and wrist in ADLs and IADLs. Patient can benefit from skilled OT services to decrease scar tissue, and pain and increase motion and strength to return to prior level of function.    OT Occupational Profile and History Problem Focused Assessment - Including review of records relating to presenting problem    Occupational performance deficits (Please refer to evaluation for details): ADL's;IADL's;Work;Play;Social Participation;Leisure    Body Structure / Function / Physical Skills ADL;Decreased knowledge of precautions;Flexibility;ROM;UE functional use;Scar  mobility;FMC;Dexterity;Sensation;Strength;Pain;IADL;Edema;Coordination    Rehab Potential Fair    Clinical Decision Making Limited treatment options, no task modification necessary    Comorbidities Affecting Occupational Performance: None    Modification or Assistance to Complete Evaluation  No modification of tasks or assist necessary to complete eval    OT Frequency Biweekly    OT Duration 8 weeks    OT Treatment/Interventions Self-care/ADL training;Therapeutic exercise;Patient/family education;Splinting;Paraffin;Fluidtherapy;Contrast Bath;DME and/or AE instruction;Manual Therapy;Passive range of motion;Scar mobilization;Therapeutic activities    Consulted and Agree  with Plan of Care Patient             Patient will benefit from skilled therapeutic intervention in order to improve the following deficits and impairments:   Body Structure / Function / Physical Skills: ADL, Decreased knowledge of precautions, Flexibility, ROM, UE functional use, Scar mobility, FMC, Dexterity, Sensation, Strength, Pain, IADL, Edema, Coordination       Visit Diagnosis: Muscle weakness (generalized)  Stiffness of right hand, not elsewhere classified  Stiffness of right wrist, not elsewhere classified  Scar condition and fibrosis of skin    Problem List There are no problems to display for this patient.   Oletta Cohn, OTR/L,CLT 12/10/2022, 8:25 PM  [facility] [address] [place], [address] Phone: [phone]   Fax:  [phone]  Name: Austin Blankenship MRN: [id number] Date of Birth: 06/04/1985

## 2022-12-19 ENCOUNTER — Ambulatory Visit: Payer: 59 | Admitting: Occupational Therapy

## 2022-12-19 DIAGNOSIS — M6281 Muscle weakness (generalized): Secondary | ICD-10-CM

## 2022-12-19 DIAGNOSIS — M25631 Stiffness of right wrist, not elsewhere classified: Secondary | ICD-10-CM

## 2022-12-19 DIAGNOSIS — M25641 Stiffness of right hand, not elsewhere classified: Secondary | ICD-10-CM

## 2022-12-19 DIAGNOSIS — L905 Scar conditions and fibrosis of skin: Secondary | ICD-10-CM

## 2022-12-19 DIAGNOSIS — M25531 Pain in right wrist: Secondary | ICD-10-CM

## 2022-12-19 NOTE — Therapy (Signed)
[facility] [address] [place], [address] Phone: [phone]   Fax:  [phone]  Occupational Therapy Treatment  Patient Details  Name: Austin Blankenship MRN: [id number] Date of Birth: [birth year] Referring Provider (OT): Bruneau PA   Encounter Date: 12/19/2022   OT End of Session - 12/19/22 1804     Visit Number 31    Number of Visits 35    Date for OT Re-Evaluation 02/04/23    OT Start Time 1202    OT Stop Time 1240    OT Time Calculation (min) 38 min    Activity Tolerance Patient tolerated treatment well    Behavior During Therapy [facility] for tasks assessed/performed             Past Medical History:  Diagnosis Date   Medical history non-contributory     Past Surgical History:  Procedure Laterality Date   CARPAL TUNNEL RELEASE Right 06/28/2022   Procedure: CARPAL TUNNEL RELEASE;  Surgeon: [name], MD;  Location: [facility];  Service: Orthopedics;  Laterality: Right;   FRACTURE SURGERY     left leg.  age 37 or 37   HARDWARE REMOVAL Right 10/01/2022   Procedure: Right wrist dorsal plate removal;  Surgeon: [name], MD;  Location: [facility];  Service: Orthopedics;  Laterality: Right;  EXPLANTED 1 VOLAR PLATE AND 5 PEGS FROM RIGHT WRIST BY DR. Rosita Kea.   OPEN REDUCTION INTERNAL FIXATION (ORIF) DISTAL RADIAL FRACTURE Right 06/28/2022   Procedure: OPEN REDUCTION INTERNAL FIXATION (ORIF) DISTAL RADIUS FRACTURE;  Surgeon: [name], MD;  Location: [facility];  Service: Orthopedics;  Laterality: Right;   TONSILLECTOMY     and Adenoids as child    There were no vitals filed for this visit.   Subjective Assessment - 12/19/22 1803     Subjective  I did not get to the gym yet I am going the morning.  I am starting maybe a new job on Monday.  I tried some push-ups or weight shifting yesterday.  And I am using my hand more working on my cars and other peoples cars and tools.  I  can tell have more strength not as stiff and achy.    Pertinent History 07/12/22 ORTHo NOTE- Terrelle Slingerland is a 37 y.o. male who presents today for suture removal status post a open reduction internal fixation of right distal radius fracture in addition to a carpal tunnel release. The patient suffered a right distal radius fracture and underwent surgery with Dr. Rosita Kea on 06/28/2022. The patient had a complex distal radius fracture which did require use of both the volar and dorsal plate. The patient also suffered acute carpal tunnel at the time the injury and did have to undergo a carpal tunnel release. The patient was evaluated for skin check and was placed in a Velcro wrist splint, he was instructed to begin using his hand and flexing and extending his fingers. The patient presents today with still moderate swelling throughout the right hand, he does still report a burning in the median nerve distribution but does feel that the sensation is improving. He has been working on trying to make a full fist. He has been nonweightbearing to the right upper extremity. The patient is taking hydrocodone as needed for discomfort. Pain score today is a 7 out of 10. 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 No/denies                [facility] OT Assessment - 12/19/22 0001       Strength   Right Hand Grip (lbs) 60    Right Hand Lateral Pinch 25 lbs    Right Hand 3 Point Pinch 23 lbs    Left Hand Grip (lbs) 100    Left Hand Lateral Pinch 27 lbs    Left Hand 3 Point Pinch 23 lbs              Patient show great progress in grip and prehension strength.  Report increased use and gripping and lifting and pulling fixing  cars. Plan to go to [employer].             cont gripping pulling with ulnar  side of hand dark blue putty 20 reps Followed but horizontal wrist ext - 20 reps dark blue putty  Pt have increase motion for composite flexion now  Lat and 3 point pinch into dark blue firm  putty - 15 reps  Also can do twisting and pulling like taffy  2 x day    Patient report try to do some floor push-ups or weight shifting. Reviewed with patient" gradually shifting weight for wrist extension as well as walking hands forward and back.  Pain-free But has to do table wall slides prior to attempting floor.   Able to do 65 extension and flexion 70  Cont end range PROM wrist flexion, sup and pronation  During day  Scar massage       Reviewed with patient working out on [employer] (medical) scapular retraction  55 lbs and chest press 45 pounds pain-free 12 reps   Chest press bilateral with trunk rotation - pull L and R hand unilateral 10 pounds - chest press bilaterally 12 reps each side   Weighted ball thrown in the floor bilaterally picking up 15 pounds   10 lbs kettlebell - pick up over head R and then 25 lbs to chest bilateral  8 lbs for wrist extention  Symptoms free Roll 8 lbs weight ball sup /pro - palm to palm  Catch and throw 4 kg ball on rebounder bilaterally first and then right hand no increase symptoms or pain   Encourage pt again  can start at [employer] working on some of these workouts at home for conditioning keeping it pain-free                OT Education - 12/19/22 1804     Education Details upgrade in HEP    Person(s) Educated Patient    Methods Explanation;Demonstration;Tactile cues;Verbal cues;Handout    Comprehension Verbal cues required;Returned demonstration;Verbalized understanding              OT Short Term Goals - 10/24/22 1033       OT SHORT TERM GOAL #1   Title Patient to be independent in home program to decrease edema and  increase motion for pain and to be able to lay fingers and wrist straight on table.    Status Achieved               OT Long Term Goals -  12/10/22 2021       OT LONG TERM GOAL #1   Title Digit flexion increased for patient to touch palm without increased symptoms to initiate using it in ADLs    Status Achieved      OT LONG TERM GOAL #2   Title Right wrist active range of motion increased to within functional limits for patient to use right hand in 50% of ADLs    Status Achieved      OT LONG TERM GOAL #3   Title Right forearm supination pronation increased to within functional limits for patient to turn doorknob with minimal symptoms    Status Achieved      OT LONG TERM GOAL #4   Title Right digit extension as well as thumb palmar radial abduction within functional limits to put hand in pocket and don gloves with no issues    Status Achieved      OT LONG TERM GOAL #5   Title Strength in right hand and wrist for patient to be able to carry 5 pounds with no increase symptoms, push and pull heavy door and initiate using tools    Status Achieved      Long Term Additional Goals   Additional Long Term Goals Yes      OT LONG TERM GOAL #6   Title Pt to show independence and carryover with home program progression at home and gym for patient to be able to use hands symptom-free and his work activities repairing cars as well as yard work and around the house    Baseline Patient still with some discomfort past 85 degrees of pronation and supination, as well as ulnar deviation and past 1670 degrees of wrist extension and flexion.  Patient fearful to progress at home and gym increasing weight.  OT initiated last 2 sessions more conditioning with weights in gym equipment.    Time 8    Period Weeks    Status New    Target Date 02/04/23                   Plan - 12/19/22 1805     Clinical Impression Statement Patient  followed by OT  for s/p  right distal radius fracture with  volar and dorsal plate, and carpal tunnel release by Dr. Rosita Kea 06/28/2022.  Pt had dorsal plate removed 7/82/95 and return with v/o from Dr Rosita Kea to cont OT - to increase ROM - no restrictions.  Pt report  he no longer feels a block with wrist movement. Pt made great progress in scar tissue, pain , motion and strenght from [facility] - but cont to have scar tissue and decrease wrist ext but improving every session -   65 ext and 70 flexion- and 85 degrees of  sup and pronation. Pt reminded again to cont to focus daily on wrist flexion, ext  stretches -and sup /pro end range-  done this date weight bearing on floor and how to progress. Pt plan to go to gym tomorrow. Strengthening doing gym, weight activities- pt able to do scapula retraction 55 lbs and chest press 45 lbs-  on Bi[employer] , weighted ball 15 lbs throw and pick up bilateral - pull one hand with R biodex 10 lbs for chest press- and cow bell R hand 10 lbs and over head- and bilateral to chest 25 lbs -keeping it pain free- pt report he has member ship to [employer] - pt to start working out pain free- keep weight  same as today and gradually increase pain free.  Grip increase greatly compare to last visit - Pt to cont with dark firm putty for gripping, pulling , pinches- wall and table weigth shifting for wrist ext with easing into on floor weight shifting painfree  -  Pt show inrease functional use of R hand -   Pt limited in functional use of right hand and wrist in  IADLs. Patient can benefit from skilled OT services to decrease scar tissue, and pain and increase motion and strength to return to prior level of function.    OT Occupational Profile and History Problem Focused Assessment - Including review of records relating to presenting problem    Occupational performance deficits (Please refer to evaluation for details): ADL's;IADL's;Work;Play;Social Participation;Leisure    Body Structure / Function / Physical Skills ADL;Decreased knowledge of  precautions;Flexibility;ROM;UE functional use;Scar mobility;FMC;Dexterity;Sensation;Strength;Pain;IADL;Edema;Coordination    Rehab Potential Fair    Clinical Decision Making Limited treatment options, no task modification necessary    Comorbidities Affecting Occupational Performance: None    Modification or Assistance to Complete Evaluation  No modification of tasks or assist necessary to complete eval    OT Frequency Biweekly    OT Duration 8 weeks    OT Treatment/Interventions Self-care/ADL training;Therapeutic exercise;Patient/family education;Splinting;Paraffin;Fluidtherapy;Contrast Bath;DME and/or AE instruction;Manual Therapy;Passive range of motion;Scar mobilization;Therapeutic activities    Consulted and Agree with Plan of Care Patient             Patient will benefit from skilled therapeutic intervention in order to improve the following deficits and impairments:   Body Structure / Function / Physical Skills: ADL, Decreased knowledge of precautions, Flexibility, ROM, UE functional use, Scar mobility, FMC, Dexterity, Sensation, Strength, Pain, IADL, Edema, Coordination       Visit Diagnosis: Muscle weakness (generalized)  Stiffness of right hand, not elsewhere classified  Stiffness of right wrist, not elsewhere classified  Scar condition and fibrosis of skin  Pain in right wrist    Problem List There are no problems to display for this patient.   Oletta Cohn, OTR/L,CLT 12/19/2022, 6:09 PM  [facility] [address] [place], [address] Phone: [phone]   Fax:  [phone]  Name: Austin Blankenship MRN: [id number] Date of Birth: 12-11-1985

## 2023-01-03 ENCOUNTER — Ambulatory Visit: Payer: 59 | Attending: Student | Admitting: Occupational Therapy

## 2023-02-28 ENCOUNTER — Other Ambulatory Visit: Payer: Self-pay | Admitting: Orthopedic Surgery

## 2023-03-03 ENCOUNTER — Encounter: Payer: Self-pay | Admitting: Orthopedic Surgery

## 2023-03-11 ENCOUNTER — Ambulatory Visit: Payer: Self-pay | Admitting: Anesthesiology

## 2023-03-11 ENCOUNTER — Other Ambulatory Visit: Payer: Self-pay

## 2023-03-11 ENCOUNTER — Encounter
Admission: RE | Disposition: A | Discharge status: Home / Self Care | Payer: Self-pay | Source: Ambulatory Visit | Attending: Orthopedic Surgery

## 2023-03-11 ENCOUNTER — Ambulatory Visit
Admission: RE | Admit: 2023-03-11 | Discharge: 2023-03-11 | Disposition: A | Discharge status: Home / Self Care | Payer: 59 | Source: Ambulatory Visit | Attending: Orthopedic Surgery | Admitting: Orthopedic Surgery

## 2023-03-11 ENCOUNTER — Encounter: Payer: Self-pay | Admitting: Orthopedic Surgery

## 2023-03-11 DIAGNOSIS — F17200 Nicotine dependence, unspecified, uncomplicated: Secondary | ICD-10-CM | POA: Insufficient documentation

## 2023-03-11 DIAGNOSIS — M25531 Pain in right wrist: Secondary | ICD-10-CM | POA: Diagnosis present

## 2023-03-11 DIAGNOSIS — M7011 Bursitis, right hand: Secondary | ICD-10-CM | POA: Insufficient documentation

## 2023-03-11 HISTORY — PX: HARDWARE REMOVAL: SHX979

## 2023-03-11 SURGERY — REMOVAL, HARDWARE
Anesthesia: General | Site: Hand | Laterality: Right

## 2023-03-11 MED ORDER — MIDAZOLAM HCL 5 MG/5ML IJ SOLN
INTRAMUSCULAR | Status: DC | PRN
  Administered 2023-03-11 (×2): 1 mg via INTRAVENOUS

## 2023-03-11 MED ORDER — 0.9 % SODIUM CHLORIDE (POUR BTL) OPTIME
TOPICAL | Status: DC | PRN
  Administered 2023-03-11: 60 mL

## 2023-03-11 MED ORDER — OXYCODONE HCL 5 MG/5ML PO SOLN: 5.0000 mg | Freq: Once | ORAL | Status: DC | PRN

## 2023-03-11 MED ORDER — LIDOCAINE HCL (PF) 2 % IJ SOLN
INTRAMUSCULAR | Status: AC
  Filled 2023-03-11: qty 5

## 2023-03-11 MED ORDER — HYDROCODONE-ACETAMINOPHEN 5-325 MG PO TABS: 1.0000 | ORAL_TABLET | ORAL | 0 refills | Status: AC | PRN

## 2023-03-11 MED ORDER — PROPOFOL 10 MG/ML IV BOLUS
INTRAVENOUS | Status: DC | PRN
  Administered 2023-03-11: 200 mg via INTRAVENOUS

## 2023-03-11 MED ORDER — BUPIVACAINE HCL 0.5 % IJ SOLN
INTRAMUSCULAR | Status: DC | PRN
  Administered 2023-03-11: 10 mL

## 2023-03-11 MED ORDER — DROPERIDOL 2.5 MG/ML IJ SOLN: 0.6250 mg | Freq: Once | INTRAMUSCULAR | Status: DC | PRN

## 2023-03-11 MED ORDER — PROPOFOL 10 MG/ML IV BOLUS
INTRAVENOUS | Status: AC
  Filled 2023-03-11: qty 20

## 2023-03-11 MED ORDER — ACETAMINOPHEN 10 MG/ML IV SOLN
1000.0000 mg | Freq: Once | INTRAVENOUS | Status: DC | PRN
Start: 2023-03-11 — End: 2023-03-11

## 2023-03-11 MED ORDER — OXYCODONE HCL 5 MG PO TABS: 5.0000 mg | ORAL_TABLET | Freq: Once | ORAL | Status: DC | PRN

## 2023-03-11 MED ORDER — SODIUM CHLORIDE 0.9 % IV SOLN: INTRAVENOUS | Status: DC | PRN

## 2023-03-11 MED ORDER — MIDAZOLAM HCL 2 MG/2ML IJ SOLN
INTRAMUSCULAR | Status: AC
Start: 2023-03-11 — End: ?
  Filled 2023-03-11: qty 2

## 2023-03-11 MED ORDER — FENTANYL CITRATE (PF) 100 MCG/2ML IJ SOLN
INTRAMUSCULAR | Status: DC | PRN
  Administered 2023-03-11: 25 ug via INTRAVENOUS
  Administered 2023-03-11: 50 ug via INTRAVENOUS
  Administered 2023-03-11: 25 ug via INTRAVENOUS

## 2023-03-11 MED ORDER — CEFAZOLIN SODIUM-DEXTROSE 2-4 GM/100ML-% IV SOLN
2.0000 g | INTRAVENOUS | Status: AC
  Administered 2023-03-11: 2 g via INTRAVENOUS

## 2023-03-11 MED ORDER — ONDANSETRON HCL 4 MG/2ML IJ SOLN
INTRAMUSCULAR | Status: DC | PRN
  Administered 2023-03-11: 4 mg via INTRAVENOUS

## 2023-03-11 MED ORDER — DEXMEDETOMIDINE HCL IN NACL 400 MCG/100ML IV SOLN
INTRAVENOUS | Status: DC | PRN
  Administered 2023-03-11 (×5): 4 ug via INTRAVENOUS

## 2023-03-11 MED ORDER — ACETAMINOPHEN 10 MG/ML IV SOLN
INTRAVENOUS | Status: AC
  Filled 2023-03-11: qty 100

## 2023-03-11 MED ORDER — CEFAZOLIN SODIUM-DEXTROSE 2-3 GM-%(50ML) IV SOLR
INTRAVENOUS | Status: AC
  Filled 2023-03-11: qty 50

## 2023-03-11 MED ORDER — FENTANYL CITRATE PF 50 MCG/ML IJ SOSY: 25.0000 ug | PREFILLED_SYRINGE | INTRAMUSCULAR | Status: DC | PRN

## 2023-03-11 MED ORDER — DEXAMETHASONE SODIUM PHOSPHATE 4 MG/ML IJ SOLN
INTRAMUSCULAR | Status: AC
  Filled 2023-03-11: qty 1

## 2023-03-11 MED ORDER — ACETAMINOPHEN 10 MG/ML IV SOLN
INTRAVENOUS | Status: DC | PRN
  Administered 2023-03-11: 1000 mg via INTRAVENOUS

## 2023-03-11 MED ORDER — FENTANYL CITRATE (PF) 100 MCG/2ML IJ SOLN
INTRAMUSCULAR | Status: AC
  Filled 2023-03-11: qty 2

## 2023-03-11 MED ORDER — DEXAMETHASONE SODIUM PHOSPHATE 4 MG/ML IJ SOLN
INTRAMUSCULAR | Status: DC | PRN
  Administered 2023-03-11: 4 mg via INTRAVENOUS

## 2023-03-11 MED ORDER — LIDOCAINE HCL (CARDIAC) PF 100 MG/5ML IV SOSY
PREFILLED_SYRINGE | INTRAVENOUS | Status: DC | PRN
  Administered 2023-03-11: 100 mg via INTRATRACHEAL

## 2023-03-11 MED ORDER — ONDANSETRON HCL 4 MG/2ML IJ SOLN
INTRAMUSCULAR | Status: AC
  Filled 2023-03-11: qty 2

## 2023-03-11 SURGICAL SUPPLY — 15 items
BNDG ELASTIC 3X5.8 VLCR NS LF (GAUZE/BANDAGES/DRESSINGS) IMPLANT
CHLORAPREP W/TINT 26 (MISCELLANEOUS) ×1 IMPLANT
COVER LIGHT HANDLE UNIVERSAL (MISCELLANEOUS) ×2 IMPLANT
DRAPE FLUOR MINI C-ARM 54X84 (DRAPES) IMPLANT
ELECT REM PT RETURN 9FT ADLT (ELECTROSURGICAL) ×1
ELECTRODE REM PT RTRN 9FT ADLT (ELECTROSURGICAL) ×1 IMPLANT
GAUZE SPONGE 4X4 12PLY STRL (GAUZE/BANDAGES/DRESSINGS) ×1 IMPLANT
GAUZE XEROFORM 1X8 LF (GAUZE/BANDAGES/DRESSINGS) ×1 IMPLANT
GLOVE SURG SYN 9.0 PF PI (GLOVE) ×1 IMPLANT
GOWN STRL REIN 2XL XLG LVL4 (GOWN DISPOSABLE) ×1 IMPLANT
GOWN STRL REUS W/ TWL LRG LVL3 (GOWN DISPOSABLE) ×1 IMPLANT
KIT TURNOVER KIT A (KITS) ×1 IMPLANT
NS IRRIG 500ML POUR BTL (IV SOLUTION) ×1 IMPLANT
PACK EXTREMITY ARMC (MISCELLANEOUS) ×1 IMPLANT
PADDING CAST BLEND 3X4 STRL (MISCELLANEOUS) IMPLANT

## 2023-03-11 NOTE — Anesthesia Postprocedure Evaluation (Signed)
Anesthesia Post Note  Patient: Austin Blankenship  Procedure(s) Performed: Right wrist hardware removal (Right: Hand)  Patient location during evaluation: PACU Anesthesia Type: General Level of consciousness: awake and alert Pain management: pain level controlled Vital Signs Assessment: post-procedure vital signs reviewed and stable Respiratory status: spontaneous breathing, nonlabored ventilation and respiratory function stable Cardiovascular status: blood pressure returned to baseline and stable Postop Assessment: no apparent nausea or vomiting Anesthetic complications: no   No notable events documented.   Last Vitals:  Vitals:   03/11/23 1428 03/11/23 1454  BP: (!) 93/59 110/80  Pulse: (!) 58 61  Resp: 20 10  Temp: (!) 36.4 C (!) 36.4 C  SpO2: 100% 100%    Last Pain:  Vitals:   03/11/23 1454  TempSrc:   PainSc: 0-No pain                 Foye Deer

## 2023-03-11 NOTE — Transfer of Care (Signed)
Immediate Anesthesia Transfer of Care Note  Patient: Austin Blankenship  Procedure(s) Performed: Right wrist hardware removal (Right: Hand)  Patient Location: PACU  Anesthesia Type: General  Level of Consciousness: awake, alert  and patient cooperative  Airway and Oxygen Therapy: Patient Spontanous Breathing and Patient connected to supplemental oxygen  Post-op Assessment: Post-op Vital signs reviewed, Patient's Cardiovascular Status Stable, Respiratory Function Stable, Patent Airway and No signs of Nausea or vomiting  Post-op Vital Signs: Reviewed and stable  Complications: No notable events documented.

## 2023-03-11 NOTE — Anesthesia Preprocedure Evaluation (Addendum)
Anesthesia Evaluation  Patient identified by MRN, date of birth, ID band Patient awake    Reviewed: Allergy & Precautions, H&P , NPO status , Patient's Chart, lab work & pertinent test results  Airway Mallampati: III  TM Distance: >3 FB Neck ROM: Full    Dental no notable dental hx.    Pulmonary Current Smoker and Patient abstained from smoking.   Pulmonary exam normal breath sounds clear to auscultation       Cardiovascular negative cardio ROS Normal cardiovascular exam Rhythm:Regular Rate:Normal     Neuro/Psych negative neurological ROS  negative psych ROS   GI/Hepatic negative GI ROS, Neg liver ROS,,,  Endo/Other  negative endocrine ROS    Renal/GU negative Renal ROS  negative genitourinary   Musculoskeletal negative musculoskeletal ROS (+)    Abdominal Normal abdominal exam  (+)   Peds negative pediatric ROS (+)  Hematology negative hematology ROS (+)   Anesthesia Other Findings   Reproductive/Obstetrics negative OB ROS                             Anesthesia Physical Anesthesia Plan  ASA: 2  Anesthesia Plan: General   Post-op Pain Management: Toradol IV (intra-op) and Ofirmev IV (intra-op)   Induction: Intravenous  PONV Risk Score and Plan: Ondansetron, Dexamethasone and Midazolam  Airway Management Planned: LMA  Additional Equipment:   Intra-op Plan:   Post-operative Plan: Extubation in OR  Informed Consent: I have reviewed the patients History and Physical, chart, labs and discussed the procedure including the risks, benefits and alternatives for the proposed anesthesia with the patient or authorized representative who has indicated his/her understanding and acceptance.     Dental Advisory Given  Plan Discussed with: Anesthesiologist, CRNA and Surgeon  Anesthesia Plan Comments:        Anesthesia Quick Evaluation

## 2023-03-11 NOTE — H&P (Signed)
Chief Complaint Patient presents with Right Wrist - Follow-up, Pain   History of the Present Illness: Austin Blankenship is a 38 y.o. male who underwent right wrist hardware removal on 10/01/2022. He had a complex distal radius fracture, treated with dorsal and volar plating. He presents today with increased wrist pain.  He reports swelling and sensitivity in his right wrist, which he suspects may be due to tendon irritation. He notes 2 weeks ago, he experienced significant swelling and redness. He denies any numbness or tingling. His wrist aches when he goes outside in cold weather. The patient reports limited range of motion due to pain.  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  I have reviewed past medical, surgical, social and family history, and allergies as documented in the EMR.  Past Medical History: No past medical history on file.  Past Surgical History: Past Surgical History: Procedure Laterality Date OPEN REDUCTION INTERNAL FIXATION (ORIF) DISTAL RADIUS FRACTURE (Right) CARPAL TUNNEL RELEASE Right 06/28/2022 Dr. Rosita Kea FRACTURE SURGERY TONSILLECTOMY  Past Family History: Family History Problem Relation Age of Onset No Known Problems Mother No Known Problems Father  Medications: Current Outpatient Medications Medication Sig Dispense Refill doxycycline (VIBRA-TABS) 100 MG tablet Take 100 mg by mouth 2 (two) times daily methylPREDNISolone (MEDROL DOSEPACK) 4 mg tablet Follow package directions. 21 tablet 0  No current facility-administered medications for this visit.  Allergies: No Known Allergies  Body mass index is 27.78 kg/m.  Review of Systems: A comprehensive 14 point ROS was performed, reviewed, and the pertinent orthopaedic findings are documented in the  HPI.  There were no vitals filed for this visit.  General Physical Examination:  General/Constitutional: No apparent distress: well-nourished and well developed. Eyes: Pupils equal, round with synchronous movement. Lungs: Clear to auscultation HEENT: Normal Vascular: No edema, swelling or tenderness, except as noted in detailed exam. Cardiac: Heart rate and rhythm is regular. Integumentary: No impressive skin lesions present, except as noted in detailed exam. Neuro/Psych: Normal mood and affect, oriented to person, place, and time.  Musculoskeletal Examination: He has full motion. Right wrist shows 45 degrees of extension and 40 degrees of flexion compared to 70 degrees of extension and 70 degrees of flexion on the left.  Radiographs:  AP, lateral, and oblique x-rays of the right wrist were ordered and personally reviewed today. These show healed fracture, loss of volar tilt, which has been present since surgery. Dorsal hardware has all been removed. He has a volar plate. There does appear to be distal radial ulnar joint degenerative changes. No significant radiocarpal degenerative change.  X-ray impression: Healed fracture with some distal radial ulnar joint degenerative changes, posttraumatic.  Assessment: ICD-10-CM 1. S/P ORIF (open reduction internal fixation) fracture Z98.890 Z87.81  Plan:  The patient has clinical findings of status post right wrist hardware removal.  He had a complex distal radius fracture treated with dorsal and volar plating, with hardware removal on 10/01/2022. X-ray today shows a healed fracture with loss of volar tilt and distal radial ulnar joint degenerative changes. There is no significant radiocarpal degenerative change. He reports increased wrist pain and tenderness, especially during certain movements. The possibility of hardware removal was discussed, with the associated risks, benefits, and recovery time. He was advised that removing the plate  might help alleviate symptoms. We discussed  that it could also potentially worsen the condition if left in place, leading to tendon irritation and possible rupture. A short course of prednisone was prescribed to alleviate inflammation. He was advised to continue working and to wear a glove to keep his hand warm.  Document Attestation: Gillermina Phy, have reviewed and updated documentation for [facility], MD, utilizing [employer] D[employer].  Electronically signed by Marlena Clipper, MD at 02/28/2023 7:12 AM EST    Reviewed  H+P. No changes noted.

## 2023-03-11 NOTE — Op Note (Signed)
03/11/2023  2:27 PM  PATIENT:  Austin Blankenship  38 y.o. male  PRE-OPERATIVE DIAGNOSIS:  S/P ORIF, open reduction internal fixation, fracture Z98.890, Z87.81  POST-OPERATIVE DIAGNOSIS:  S/P ORIF, open reduction internal fixation, fracture  PROCEDURE:  Procedure(s) with comments: Right wrist hardware removal (Right) - 1 plate and 5 screws explanted  SURGEON: Leitha Schuller, MD  ASSISTANTS: none   ANESTHESIA:   general  EBL:  Total I/O In: 400 [I.V.:400] Out: 3 [Blood:3]  BLOOD ADMINISTERED:none  DRAINS: none   LOCAL MEDICATIONS USED:  MARCAINE    and Amount: 10 ml  SPECIMEN:  No Specimen  DISPOSITION OF SPECIMEN:  N/A  COUNTS:  YES  TOURNIQUET:  * Missing tourniquet times found for documented tourniquets in log: [id number] *  IMPLANTS: none  DICTATION: .Dragon Dictation patient was brought to the operating room and after adequate general anesthesia was obtained the right arm was prepped and draped in the usual sterile fashion with a tourniquet applied the upper arm.  After patient identification and timeout procedures were completed tourniquet was raised and the prior scar which was hypertrophic on the volar wrist was elliptically excised.  The FCR tendon was identified and released from scar tissue retracted radially protect the radial artery and associated veins.  Going deep the deep fascia was incised and the pronator elevated off the distal radius and plate.  The plate was exposed without difficulty proximally the 3 large screws were removed without difficulty going distally the multidirectional screws did seem to have quite a bit of bursitis over them consistent with inflammation secondary to the plate the screws were removed and then the plate came out without difficulty the wound was thoroughly irrigated and 10 cc half percent Sensorcaine infiltrated the area of the incision to aid in postop analgesia.  The wound was then closed after letting the tourniquet down with simple  erupted 4-0 nylon followed by Xeroform 4 x 4's Webril and Ace wrap  PLAN OF CARE: Discharge to home after PACU  PATIENT DISPOSITION:  PACU - hemodynamically stable.

## 2023-03-11 NOTE — Anesthesia Procedure Notes (Signed)
Procedure Name: LMA Insertion Date/Time: 03/11/2023 1:48 PM  Performed by: Barbette Hair, CRNAPre-anesthesia Checklist: Patient identified, Emergency Drugs available, Suction available, Patient being monitored and Timeout performed Patient Re-evaluated:Patient Re-evaluated prior to induction Oxygen Delivery Method: Circle system utilized Preoxygenation: Pre-oxygenation with 100% oxygen Induction Type: IV induction Number of attempts: 1 Tube secured with: Tape Dental Injury: Teeth and Oropharynx as per pre-operative assessment

## 2023-03-11 NOTE — Discharge Instructions (Signed)
Keep arm elevated is much as possible Work on moving fingers but not the thumb Ice to the back of the hand today and tomorrow to help with pain and swelling Pain medicine as directed Call office if you are having problems [phone]

## 2023-03-12 ENCOUNTER — Encounter: Payer: Self-pay | Admitting: Orthopedic Surgery
